# Patient Record
Sex: Female | Born: 1970 | Race: Black or African American | Hispanic: No | Marital: Single | Smoking: Current every day smoker
Health system: Southern US, Community
[De-identification: ages and names within clinical notes are randomized; demographics above are authoritative.]

## PROBLEM LIST (undated history)

## (undated) DIAGNOSIS — F329 Major depressive disorder, single episode, unspecified: Secondary | ICD-10-CM

## (undated) DIAGNOSIS — F32A Depression, unspecified: Secondary | ICD-10-CM

## (undated) DIAGNOSIS — R51 Headache: Secondary | ICD-10-CM

## (undated) DIAGNOSIS — I10 Essential (primary) hypertension: Secondary | ICD-10-CM

## (undated) DIAGNOSIS — F419 Anxiety disorder, unspecified: Secondary | ICD-10-CM

## (undated) HISTORY — PX: DILATION AND CURETTAGE OF UTERUS: SHX78

## (undated) HISTORY — DX: Depression, unspecified: F32.A

## (undated) HISTORY — DX: Anxiety disorder, unspecified: F41.9

## (undated) HISTORY — DX: Major depressive disorder, single episode, unspecified: F32.9

---

## 2004-01-25 ENCOUNTER — Emergency Department (HOSPITAL_COMMUNITY): Admission: EM | Admit: 2004-01-25 | Discharge: 2004-01-25 | Payer: Self-pay | Admitting: Family Medicine

## 2004-05-05 ENCOUNTER — Emergency Department (HOSPITAL_COMMUNITY): Admission: EM | Admit: 2004-05-05 | Discharge: 2004-05-05 | Payer: Self-pay | Admitting: Family Medicine

## 2009-01-02 ENCOUNTER — Ambulatory Visit: Payer: Self-pay | Admitting: Advanced Practice Midwife

## 2009-01-02 ENCOUNTER — Inpatient Hospital Stay (HOSPITAL_COMMUNITY): Admission: AD | Admit: 2009-01-02 | Discharge: 2009-01-02 | Payer: Self-pay | Admitting: Obstetrics & Gynecology

## 2009-01-23 ENCOUNTER — Other Ambulatory Visit: Admission: RE | Admit: 2009-01-23 | Discharge: 2009-01-23 | Payer: Self-pay | Admitting: Obstetrics and Gynecology

## 2009-01-23 ENCOUNTER — Ambulatory Visit: Payer: Self-pay | Admitting: Obstetrics and Gynecology

## 2009-03-25 ENCOUNTER — Ambulatory Visit (HOSPITAL_COMMUNITY): Admission: RE | Admit: 2009-03-25 | Discharge: 2009-03-25 | Payer: Self-pay | Admitting: Obstetrics & Gynecology

## 2009-04-10 ENCOUNTER — Ambulatory Visit: Payer: Self-pay | Admitting: Obstetrics and Gynecology

## 2009-04-17 ENCOUNTER — Ambulatory Visit: Payer: Self-pay | Admitting: Family Medicine

## 2009-04-17 DIAGNOSIS — I10 Essential (primary) hypertension: Secondary | ICD-10-CM | POA: Insufficient documentation

## 2009-04-23 ENCOUNTER — Telehealth: Payer: Self-pay | Admitting: Family Medicine

## 2009-05-16 ENCOUNTER — Ambulatory Visit: Payer: Self-pay | Admitting: Family Medicine

## 2009-07-11 ENCOUNTER — Ambulatory Visit: Payer: Self-pay | Admitting: Obstetrics & Gynecology

## 2009-09-26 ENCOUNTER — Ambulatory Visit: Payer: Self-pay | Admitting: Obstetrics and Gynecology

## 2009-12-12 ENCOUNTER — Ambulatory Visit: Payer: Self-pay | Admitting: Obstetrics & Gynecology

## 2010-02-27 ENCOUNTER — Encounter (INDEPENDENT_AMBULATORY_CARE_PROVIDER_SITE_OTHER): Payer: Self-pay | Admitting: *Deleted

## 2010-02-27 ENCOUNTER — Ambulatory Visit: Payer: Self-pay | Admitting: Obstetrics & Gynecology

## 2010-02-27 ENCOUNTER — Other Ambulatory Visit
Admission: RE | Admit: 2010-02-27 | Discharge: 2010-02-27 | Payer: Self-pay | Source: Home / Self Care | Admitting: Obstetrics & Gynecology

## 2010-04-13 ENCOUNTER — Encounter: Payer: Self-pay | Admitting: *Deleted

## 2010-04-22 NOTE — Assessment & Plan Note (Signed)
Summary: NP/HTN/KF   Vital Signs:  Patient profile:   40 year old female Height:      64.5 inches Weight:      201.9 pounds BMI:     34.24 Temp:     98.6 degrees F oral Pulse rate:   102 / minute BP sitting:   151 / 99  (left arm) Cuff size:   regular  Vitals Entered By: Gladstone Pih (April 17, 2009 4:40 PM)  Nutrition Counseling: Patient's BMI is greater than 25 and therefore counseled on weight management options.  Serial Vital Signs/Assessments:  Time      Position  BP       Pulse  Resp  Temp     By 4:41 PM             162/102                        Gladstone Pih 5:03 PM             134/94                         Lequita Asal  MD  Comments: 4:41 PM re checked manually By: Gladstone Pih   CC: NP HTN Is Patient Diabetic? No Pain Assessment Patient in pain? no        CC:  NP HTN.  History of Present Illness: 40 y/o female referred from [facility] 2/2 hypertension.   HTN- strong family history. h/o migraine, but states she has had recent headaches that were different from her migraines. no slurred speech, neuro sxs, blurred vision, chest pain, SOB, or peripheral edema. initially started on hctz 25 mg by gyn without improvement in BP. norvasc 10 mg was added. BPs remained elevated. patient with h/o tobacco abuse. no side effects from meds. reports drinking " a few beers" on the weekends.   Habits & Providers  Alcohol-Tobacco-Diet     Tobacco Status: current     Tobacco Counseling: to quit use of tobacco products     Cigarette Packs/Day: 0.5  Current Medications (verified): 1)  Norvasc 10 Mg Tabs (Amlodipine Besylate) .... One Tab By Mouth Daily 2)  Hydrochlorothiazide 25 Mg Tabs (Hydrochlorothiazide) .... One Tab By Mouth Daily  Allergies (verified): No Known Drug Allergies  Past History:  Past medical, surgical, family and social histories (including risk factors) reviewed, and no changes noted (except as noted below).  Past Medical  History: abnormal uterine bleeding  Family History: Reviewed history and no changes required. HTN- sister, mom, dad, "entire family" CVA-mom DM- mom, dad  Social History: Reviewed history and no changes required. lives with 2 sons. currently employed, works Cytogeneticist. +tobacco abuse. occasional EtOH. no ilicit drug use. Smoking Status:  current Packs/Day:  0.5  Review of Systems       The patient complains of headaches.  The patient denies vision loss, chest pain, and dyspnea on exertion.    Physical Exam  General:  obese female. NAD. vitals reviewed.  Eyes:  EOMI, PERRLA Mouth:  MMM Neck:  no carotid bruits Lungs:  Normal respiratory effort, chest expands symmetrically. Lungs are clear to auscultation, no crackles or wheezes. Heart:  Normal rate and regular rhythm. S1 and S2 normal without gallop, murmur, click, rub or other extra sounds. Extremities:  no peripheral edema of BLE Neurologic:  alert & oriented X3 and cranial nerves II-XII intact.     Impression &  Recommendations:  Problem # 1:  ESSENTIAL HYPERTENSION (ICD-401.9) Assessment New BP improved but elevated on repeat. will add lisinopril. patient to return for FLP, CMP, urinalysis. follow up in 3-4 weeks.   Her updated medication list for this problem includes:    Norvasc 10 Mg Tabs (Amlodipine besylate) ..... One tab by mouth daily    Lisinopril-hydrochlorothiazide 20-25 Mg Tabs (Lisinopril-hydrochlorothiazide) ..... One tab by mouth daily  Future Orders: Comp Met-[facility] ([id number]) ... 03/28/2010 Lipid-[facility] ([id number]) ... 04/03/2010 UA Microalbumin-[facility] ([id number]) ... 03/27/2010  Complete Medication List: 1)  Norvasc 10 Mg Tabs (Amlodipine besylate) .... One tab by mouth daily 2)  Lisinopril-hydrochlorothiazide 20-25 Mg Tabs (Lisinopril-hydrochlorothiazide) .... One tab by mouth daily  Patient Instructions: 1)  Nice to have met you! 2)  We will change your blood pressure medication to  LISINOPRIL-HYDROCHLOROTHIAZIDE.  3)  Call tomorrow to schedule lab appointment 4)  Follow up with Dr. Lanier Prude in 3-4 weeks to follow up on blood pressure.  Prescriptions: LISINOPRIL-HYDROCHLOROTHIAZIDE 20-25 MG TABS (LISINOPRIL-HYDROCHLOROTHIAZIDE) one tab by mouth daily  #30 x 1   Entered and Authorized by:   Lequita Asal  MD   Signed by:   Lequita Asal  MD on 04/17/2009   Method used:   Electronically to        [facility] [address] (retail)       [id number]       [place], [place]  [address]       Ph: [phone]       Fax: [phone]   RxID:   [id number]     Prevention & Chronic Care Immunizations   Influenza vaccine: Not documented    Tetanus booster: Not documented    Pneumococcal vaccine: Not documented  Other Screening   Pap smear: Not documented    Mammogram: Not documented   Smoking status: current  (04/17/2009)  Lipids   Total Cholesterol: Not documented   LDL: Not documented   LDL Direct: Not documented   HDL: Not documented   Triglycerides: Not documented  Hypertension   Last Blood Pressure: 151 / 99  (04/17/2009)   Serum creatinine: Not documented   Serum potassium Not documented CMP ordered     Hypertension flowsheet reviewed?: Yes   Progress toward BP goal: Unchanged  Self-Management Support :   Personal Goals (by the next clinic visit) :      Personal blood pressure goal: 140/90  (04/17/2009)   Patient will work on the following items until the next clinic visit to reach self-care goals:     Medications and monitoring: take my medicines every day, bring all of my medications to every visit  (04/17/2009)     Eating: eat foods that are low in salt  (04/17/2009)     Activity: take a 30 minute walk every day, take the stairs instead of the elevator  (04/17/2009)    Hypertension self-management support: BP self-monitoring log, Written self-care plan, Education handout  (04/17/2009)    Hypertension self-care plan printed.   Hypertension education handout printed

## 2010-04-22 NOTE — Assessment & Plan Note (Signed)
Summary: BP CHECK/KH  Nurse Visit patient was scheduled with Dr. Lanier Prude today but had to reschedule due to insurance. Dr. Lanier Prude ask that her BP be checked today. BP 151/104 with Dynamap. rechecked manually using large adult cuff in right arm 160/104. pulse 80.  patient states when she checked her BP this AM it was low 104 systolic. she did not take her medication today. Dr. Lanier Prude suggested she could cut her BP med in half  until she comes in to see her  if  BP is low.  . RN suggested to patient that maybe there is a problem with her monitor and to bring it in the next time she comes  in. Theresia Lo RN  May 16, 2009 5:20 PM   Allergies: No Known Drug Allergies  Orders Added: 1)  No Charge Patient Arrived (NCPA0) [NCPA0]

## 2010-04-22 NOTE — Progress Notes (Signed)
Summary: triage  Phone Note Call from Patient Call back at [phone]   Caller: Patient Summary of Call: Taking blood pressure pills could this be a side effect? Initial call taken by: Clydell Hakim,  April 23, 2009 2:39 PM  Follow-up for Phone Call        left message Follow-up by: Golden Circle RN,  April 23, 2009 2:44 PM  Additional Follow-up for Phone Call Additional follow up Details #1::        has sore throat x 1 wk. pain is 4/10. worse hs. denies fever. smokes 1/2 ppd. offered appt. states she is going to gargle & see if it worsens. advised smoking is making it worse. has lab Thursday. told her if she feels she needs to see a doctor, call in early am Additional Follow-up by: Golden Circle RN,  April 23, 2009 2:53 PM

## 2010-05-09 ENCOUNTER — Encounter: Payer: Self-pay | Admitting: *Deleted

## 2010-05-14 ENCOUNTER — Ambulatory Visit (INDEPENDENT_AMBULATORY_CARE_PROVIDER_SITE_OTHER): Payer: BC Managed Care – PPO | Admitting: Family Medicine

## 2010-05-14 ENCOUNTER — Encounter: Payer: Self-pay | Admitting: Family Medicine

## 2010-05-14 DIAGNOSIS — G43909 Migraine, unspecified, not intractable, without status migrainosus: Secondary | ICD-10-CM | POA: Insufficient documentation

## 2010-05-14 DIAGNOSIS — I10 Essential (primary) hypertension: Secondary | ICD-10-CM

## 2010-05-14 DIAGNOSIS — R3589 Other polyuria: Secondary | ICD-10-CM | POA: Insufficient documentation

## 2010-05-14 DIAGNOSIS — R358 Other polyuria: Secondary | ICD-10-CM

## 2010-05-14 DIAGNOSIS — Z833 Family history of diabetes mellitus: Secondary | ICD-10-CM

## 2010-05-14 DIAGNOSIS — L83 Acanthosis nigricans: Secondary | ICD-10-CM

## 2010-05-14 LAB — GLUCOSE, CAPILLARY: Glucose-Capillary: 103 mg/dL — ABNORMAL HIGH (ref 70–99)

## 2010-05-14 MED ORDER — METOPROLOL SUCCINATE ER 25 MG PO TB24: 25.0000 mg | ORAL_TABLET | Freq: Every day | ORAL | Status: DC

## 2010-05-14 MED ORDER — BUTALBITAL-APAP-CAFFEINE 50-325-40 MG PO TABS: 1.0000 | ORAL_TABLET | ORAL | Status: AC | PRN

## 2010-05-14 NOTE — Progress Notes (Signed)
  Subjective:    Patient ID: Shirley Hernandez, female    DOB: June 12, 1970, 40 y.o.   MRN: [id number]  HPI 1. HTN Pt. Has an elevated BP, and runs high at home as well. 145/100 We will stop her amlodipine and start metoprolol She also complains of headaches. No visual changes.  2. Migraine Will give her fioricet, she was taking this in the past and says her ibuprofen is not helping.  3. Acanthosis, Polyuria, Polydipsia, Family history DM2 She has no prior labs. Will get a random finger stick for glucose, she last ate at 12pm     Review of Systems  All other systems reviewed and are negative.       Objective:   Physical Exam  Constitutional: She appears well-developed and well-nourished.  HENT:  Head: Normocephalic and atraumatic.  Cardiovascular: Normal rate, regular rhythm and normal heart sounds.   Pulmonary/Chest: Effort normal and breath sounds normal.          Assessment & Plan:  1. HTN Pt. Has an elevated BP, and runs high at home as well. 145/100 We will stop her amlodipine and start metoprolol She also complains of headaches. No visual changes.  2. Migraine Will give her fioricet, she was taking this in the past and says her ibuprofen is not helping.  3. Acanthosis, Polyuria, Polydipsia, Family history DM2 She has no prior labs. Will get a random finger stick for glucose, she last ate at 12pm

## 2010-05-16 ENCOUNTER — Telehealth: Payer: Self-pay | Admitting: Family Medicine

## 2010-05-16 NOTE — Telephone Encounter (Signed)
Pt still waiting on rx for lisinopril/hctz, pt goes to [facility]/[address], suppose to have been called in at last visit.

## 2010-05-19 MED ORDER — LISINOPRIL-HYDROCHLOROTHIAZIDE 20-25 MG PO TABS: 1.0000 | ORAL_TABLET | Freq: Every day | ORAL | Status: DC

## 2010-05-19 NOTE — Telephone Encounter (Signed)
I sent her prescription in.

## 2010-05-21 ENCOUNTER — Ambulatory Visit (INDEPENDENT_AMBULATORY_CARE_PROVIDER_SITE_OTHER): Payer: BC Managed Care – PPO

## 2010-05-21 DIAGNOSIS — Z3049 Encounter for surveillance of other contraceptives: Secondary | ICD-10-CM

## 2010-05-21 DIAGNOSIS — N949 Unspecified condition associated with female genital organs and menstrual cycle: Secondary | ICD-10-CM

## 2010-06-03 LAB — POCT URINALYSIS DIPSTICK
Bilirubin Urine: NEGATIVE
Glucose, UA: NEGATIVE mg/dL
Nitrite: NEGATIVE
Specific Gravity, Urine: 1.025 (ref 1.005–1.030)
Urobilinogen, UA: 0.2 mg/dL (ref 0.0–1.0)
pH: 6.5 (ref 5.0–8.0)

## 2010-06-20 ENCOUNTER — Telehealth: Payer: Self-pay | Admitting: Family Medicine

## 2010-06-20 NOTE — Telephone Encounter (Signed)
Pt states she lost rx for Toprol (migrain med) needs it called to [facility] [address]

## 2010-06-26 LAB — CBC
HCT: 36.9 % (ref 36.0–46.0)
Hemoglobin: 12.4 g/dL (ref 12.0–15.0)
MCHC: 33.7 g/dL (ref 30.0–36.0)
MCV: 89.8 fL (ref 78.0–100.0)
RDW: 14.2 % (ref 11.5–15.5)

## 2010-06-26 LAB — URINALYSIS, ROUTINE W REFLEX MICROSCOPIC
Bilirubin Urine: NEGATIVE
Glucose, UA: NEGATIVE mg/dL
Leukocytes, UA: NEGATIVE
Nitrite: NEGATIVE
Urobilinogen, UA: 1 mg/dL (ref 0.0–1.0)
pH: 6 (ref 5.0–8.0)

## 2010-06-26 LAB — URINE MICROSCOPIC-ADD ON

## 2010-07-07 ENCOUNTER — Encounter: Payer: Self-pay | Admitting: *Deleted

## 2010-07-07 ENCOUNTER — Other Ambulatory Visit: Payer: Self-pay | Admitting: Family Medicine

## 2010-07-07 NOTE — Telephone Encounter (Signed)
Need to speak with patient and unable to leave message on voicemail. Called pharmacy and asked them to have her call our office. Will forward to Dr. Rivka Safer to ask about refilling .

## 2010-07-07 NOTE — Telephone Encounter (Signed)
Refill request

## 2010-08-07 ENCOUNTER — Ambulatory Visit: Payer: BC Managed Care – PPO

## 2010-09-08 ENCOUNTER — Other Ambulatory Visit: Payer: Self-pay | Admitting: Family Medicine

## 2010-09-08 ENCOUNTER — Ambulatory Visit (INDEPENDENT_AMBULATORY_CARE_PROVIDER_SITE_OTHER): Payer: BC Managed Care – PPO

## 2010-09-08 DIAGNOSIS — Z3049 Encounter for surveillance of other contraceptives: Secondary | ICD-10-CM

## 2010-10-14 ENCOUNTER — Encounter: Payer: Self-pay | Admitting: Family Medicine

## 2010-10-14 ENCOUNTER — Ambulatory Visit (INDEPENDENT_AMBULATORY_CARE_PROVIDER_SITE_OTHER): Payer: BC Managed Care – PPO | Admitting: Family Medicine

## 2010-10-14 DIAGNOSIS — I10 Essential (primary) hypertension: Secondary | ICD-10-CM

## 2010-10-14 DIAGNOSIS — L259 Unspecified contact dermatitis, unspecified cause: Secondary | ICD-10-CM

## 2010-10-14 DIAGNOSIS — L309 Dermatitis, unspecified: Secondary | ICD-10-CM

## 2010-10-14 MED ORDER — TRIAMCINOLONE ACETONIDE 0.025 % EX OINT: TOPICAL_OINTMENT | Freq: Two times a day (BID) | CUTANEOUS | Status: DC

## 2010-10-14 MED ORDER — LISINOPRIL-HYDROCHLOROTHIAZIDE 20-25 MG PO TABS: 1.0000 | ORAL_TABLET | Freq: Every day | ORAL | Status: DC

## 2010-10-14 MED ORDER — METOPROLOL SUCCINATE ER 25 MG PO TB24: 25.0000 mg | ORAL_TABLET | Freq: Every day | ORAL | Status: DC

## 2010-10-14 NOTE — Progress Notes (Signed)
  Subjective:    Patient ID: Shirley Hernandez, female    DOB: [birth year], 40 y.o.   MRN: [id number]  HPI 1.  Hypertension:  Long-term problem for this patient.  No adverse effects from medication.  Not checking it regularly.  No HA, CP, dizziness, shortness of breath, palpitations, or LE swelling.   BP Readings from Last 3 Encounters:  10/14/10 134/96  05/14/10 147/102  04/17/09 151/99    Eczema:  Located on arms, back of neck.  Controlled in past with Triamcinolone.  Using Eucerin cream for moisturizer.  Wants to know about refill.  No asthma or SOB   Review of Systems See HPI above for review of systems.       Objective:   Physical Exam Gen:  Alert, cooperative patient who appears stated age in no acute distress.  Vital signs reviewed. Cardiac:  Regular rate and rhythm without murmur auscultated.  Good S1/S2. Pulm:  Clear to auscultation bilaterally with good air movement.  No wheezes or rales noted.   Neck: No masses or thyromegaly or limitation in range of motion.  No cervical lymphadenopathy. Ext:  No clubbing/cyanosis/erythema.  No edema noted bilateral lower extremities.   Skin:  Eczematous changes noted BL upper extremities and back of neck       Assessment & Plan:

## 2010-10-14 NOTE — Assessment & Plan Note (Signed)
Improved and now at goal. Refilled medications.  Will follow

## 2010-10-14 NOTE — Assessment & Plan Note (Signed)
Refilled Triamcinolone and counseled regarding skin care.

## 2010-10-14 NOTE — Patient Instructions (Signed)
I have refilled your medications. I have sent in the Triamcinolone.  If you have any questions, don't hesitate to call.  It was good to meet you today.

## 2010-11-13 IMAGING — US US TRANSVAGINAL NON-OB
1 series · 13 of 25 positions shown · non-contrast
Comparison: None

CLINICAL DATA: Menorrhagia

TRANSABDOMINAL AND TRANSVAGINAL ULTRASOUND OF PELVIS
TECHNIQUE: Both transabdominal and transvaginal ultrasound
examinations of the pelvis were performed including evaluation of
the uterus, ovaries, adnexal regions, and pelvic cul-de-sac.

[Series 1: us pelvis complete modify · 13 of 54 slices shown]
[im 1/54]
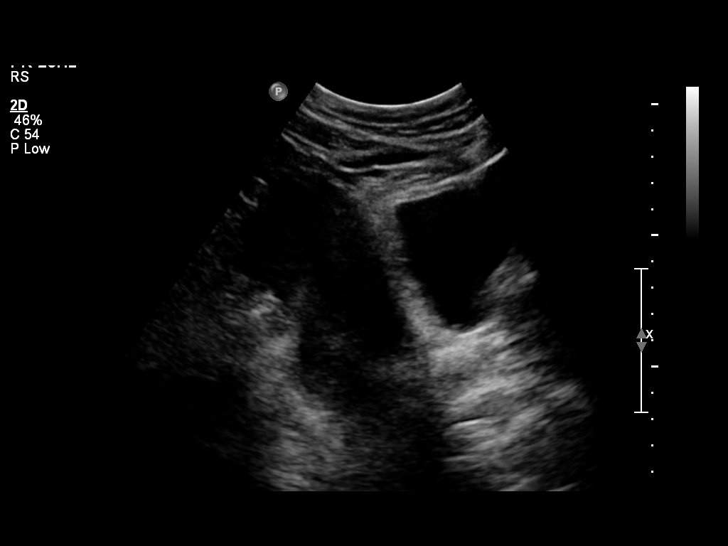
[im 5/54]
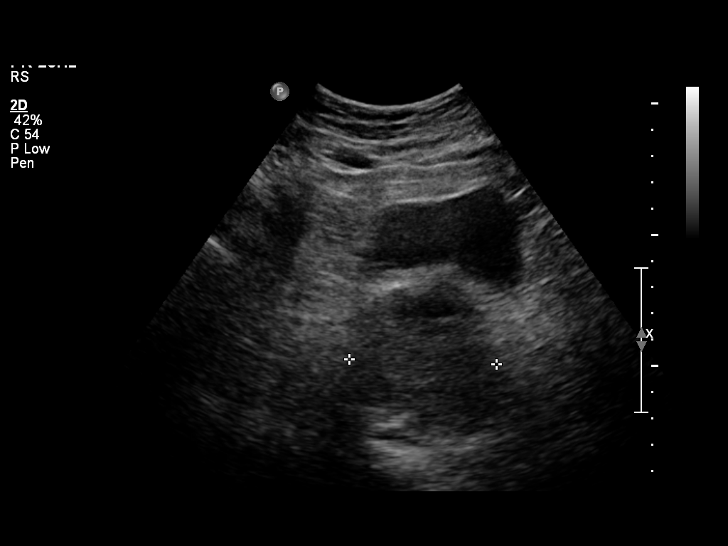
[im 9/54]
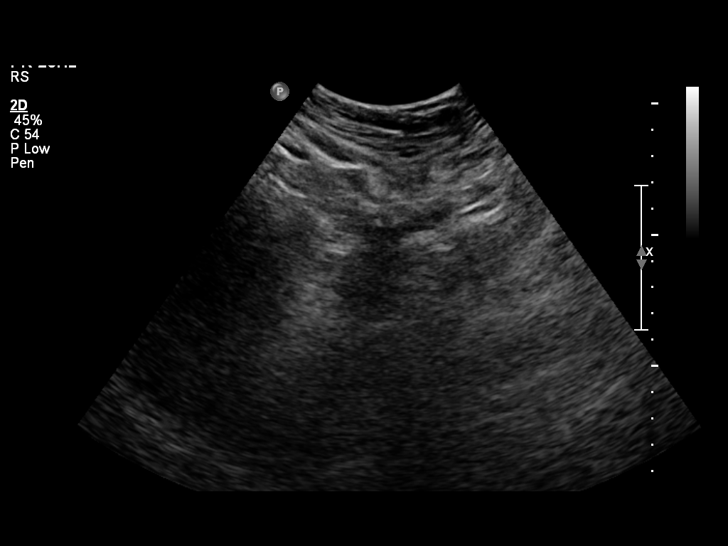
[im 14/54]
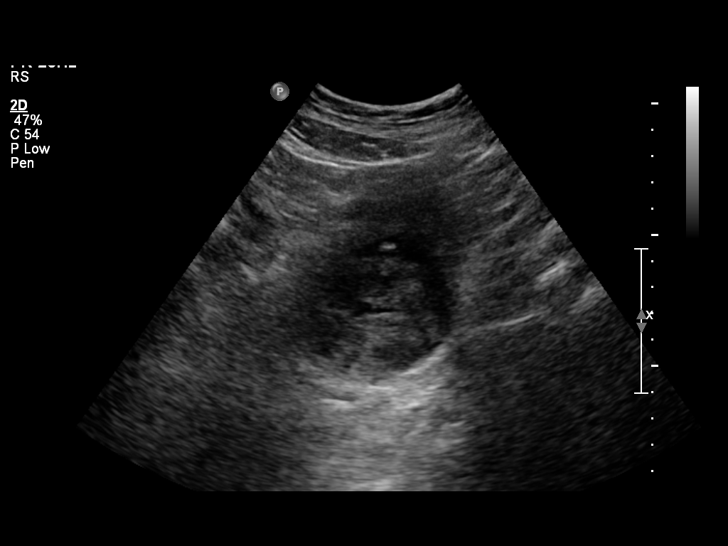
[im 18/54]
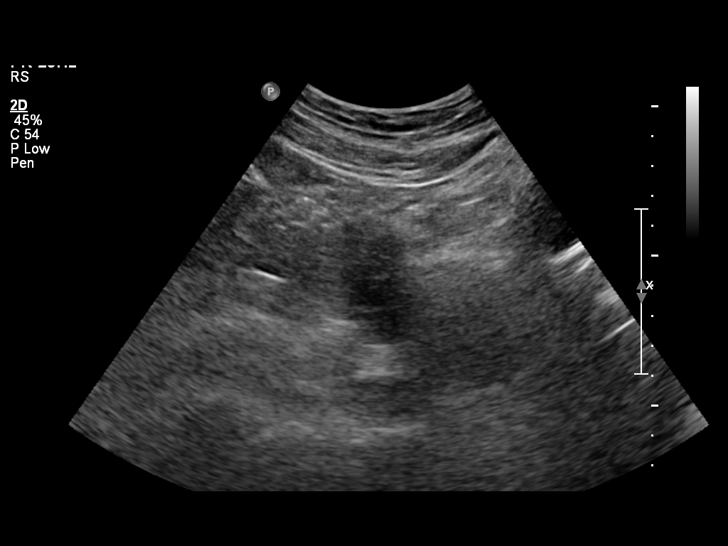
[im 23/54]
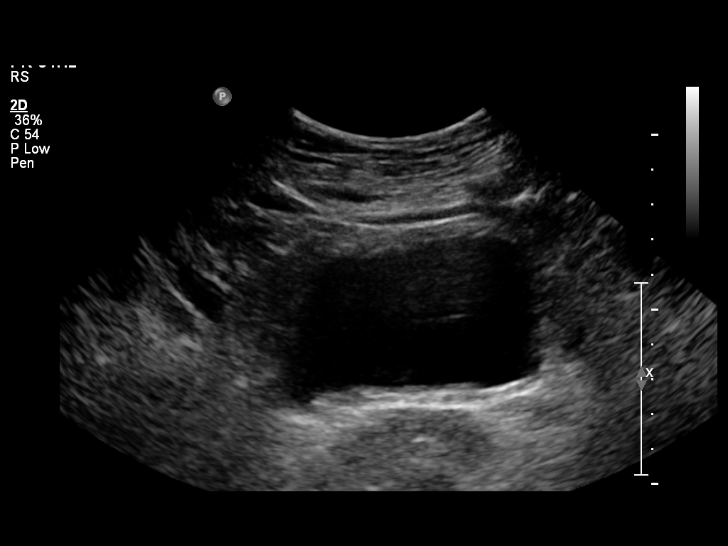
[im 27/54]
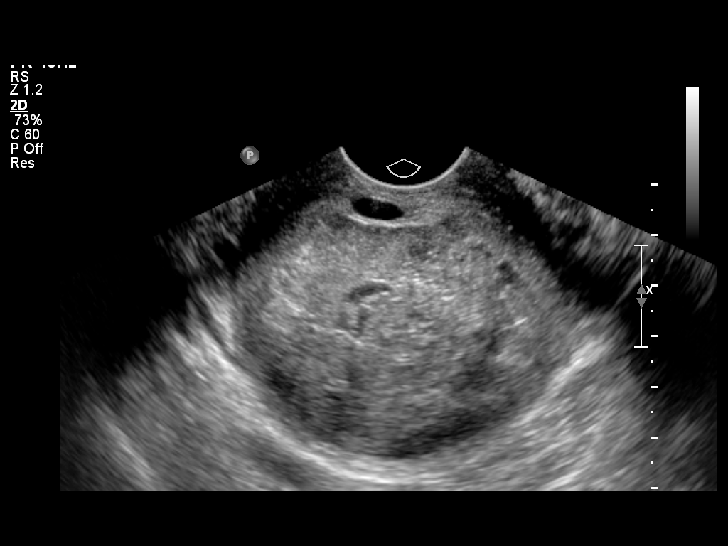
[im 31/54]
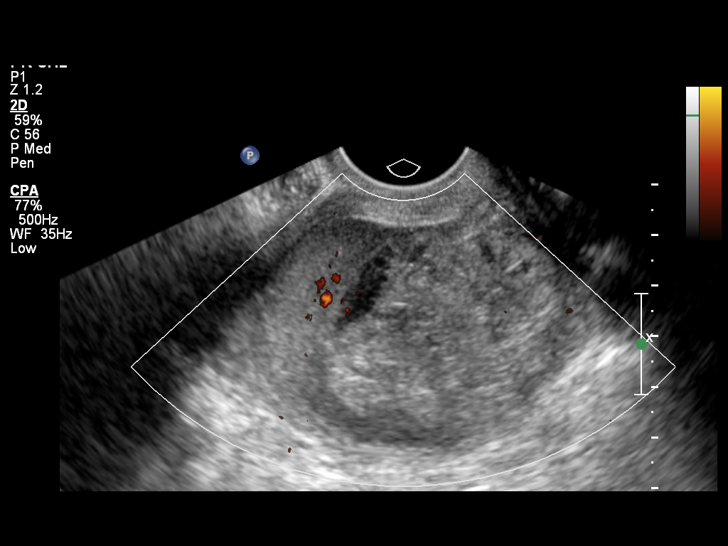
[im 36/54]
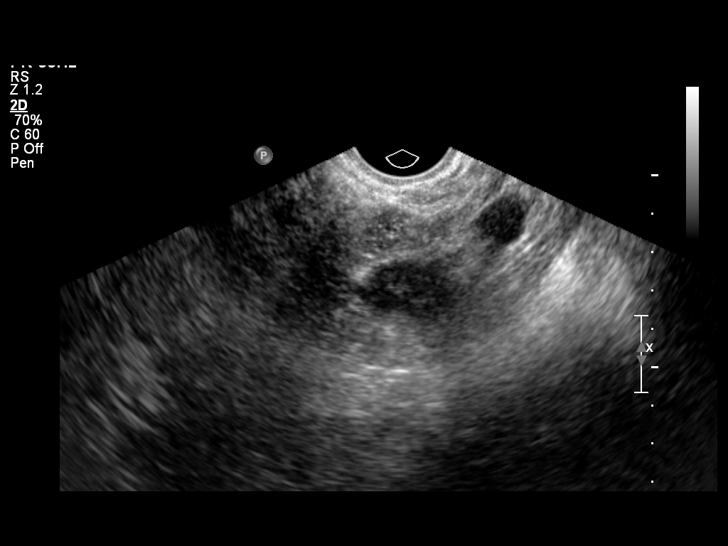
[im 40/54]
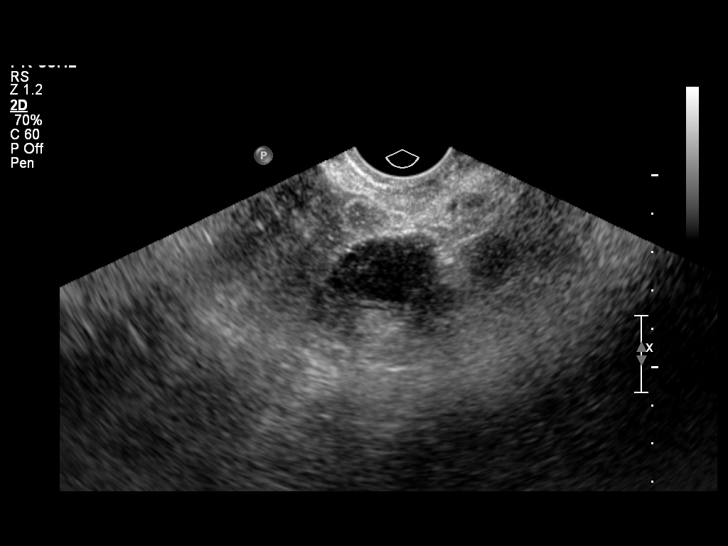
[im 45/54]
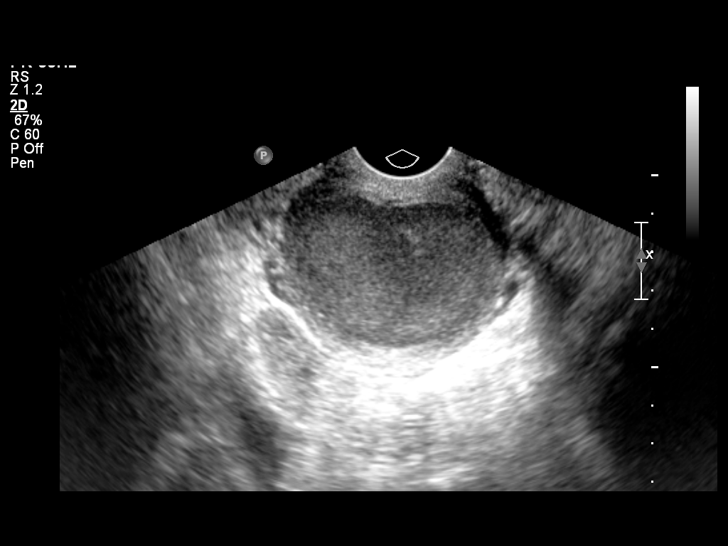
[im 49/54]
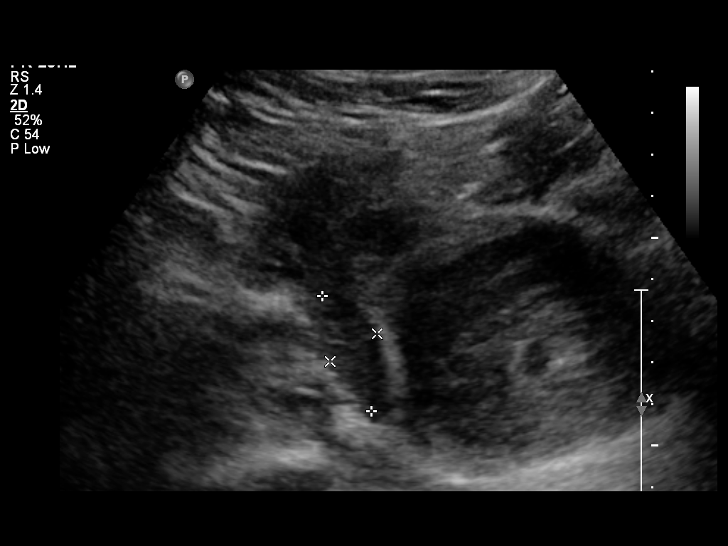
[im 54/54]
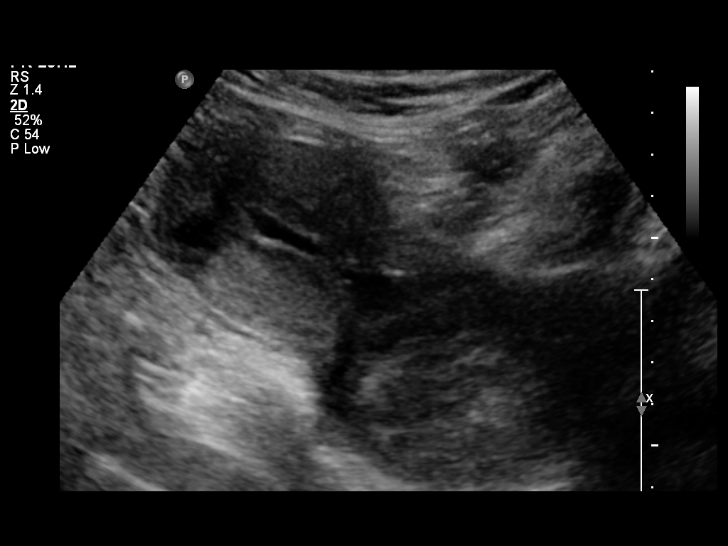

[13 of 25 positions shown; findings below may reference images not displayed]

FINDINGS: Uterus measures 9.9 x 5.2 x 4.6 cm.  On real time imaging, there is
a heterogeneous filling defect/soft tissue mass lesion in the lower
uterine segment apparently within the endometrial canal, measuring
5.4 x 5.2 x 3.9 cm.  Over the course of the examination, this was
noted on real time imaging to migrate further lower within the
lower uterine segment to the level of the cervix, with swirling
echogenic presumed fluid surrounding it.  No central color flow is
noted within this mass.

Endometrium not well visualized due to central filling defect and
debris/fluid.

Right Ovary measures 2.9 x 1.7 x 1.3 cm, unremarkable.

Left Ovary measures 2.6 x 2.1 x 1.2 cm.

Other Findings:  No free fluid.
IMPRESSION: Mass lesion within the endometrial canal which was observed to
migrated distally towards the cervix with surrounding fluid on real
time imaging.  This could represent a prolapsing fibroid, polyp, or
less likely neoplasm.  Report called to BLONDINACKA by sonographer Quirijn
Dhee at the time of imaging.

## 2010-11-26 ENCOUNTER — Ambulatory Visit: Payer: BC Managed Care – PPO

## 2011-02-03 IMAGING — US US TRANSVAGINAL NON-OB
1 series · 13 of 25 positions shown · non-contrast
Comparison: 01/02/2009

CLINICAL DATA: Pelvic pain.  Menorrhagia.  Follow-up endometrial
mass seen on prior exam.

TRANSVAGINAL ULTRASOUND OF PELVIS
TECHNIQUE: Transvaginal ultrasound examination of the pelvis was
performed including evaluation of the uterus, ovaries, adnexal
regions, and pelvic cul-de-sac.

[Series 1: us transvaginal non-ob · 0.15mm/px · 13 of 41 slices shown]
[im 1/41]
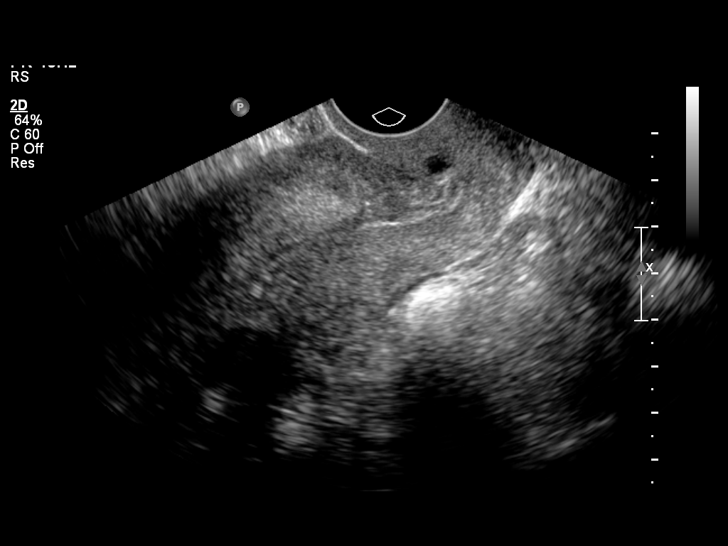
[im 4/41]
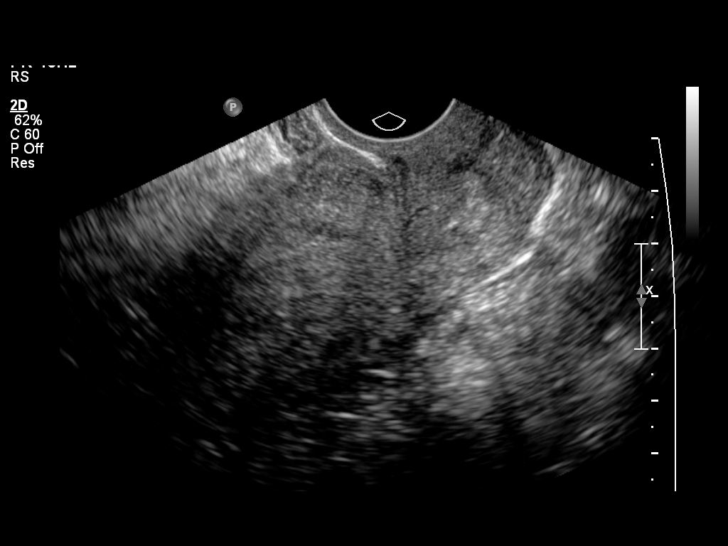
[im 7/41]
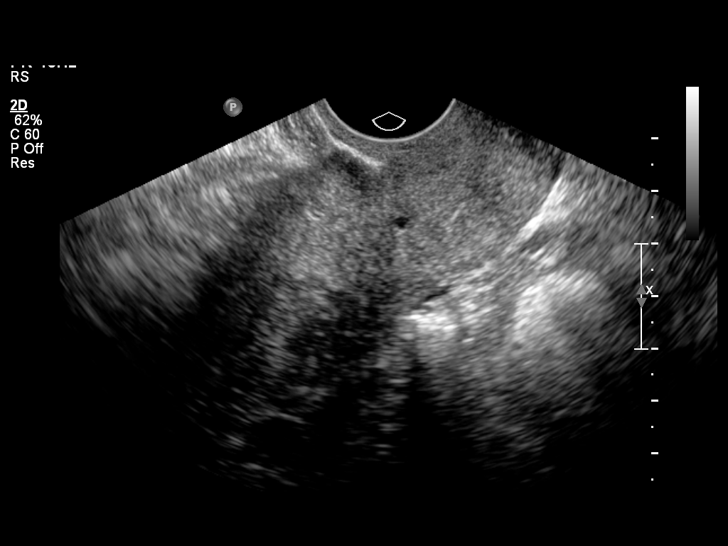
[im 11/41]
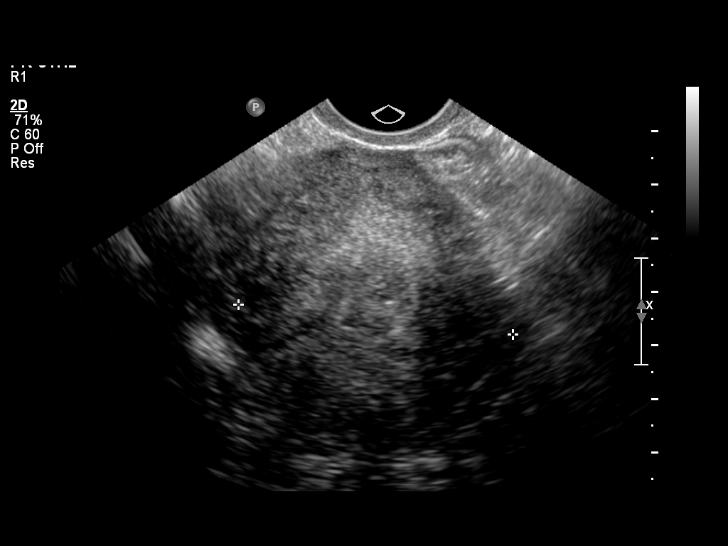
[im 14/41]
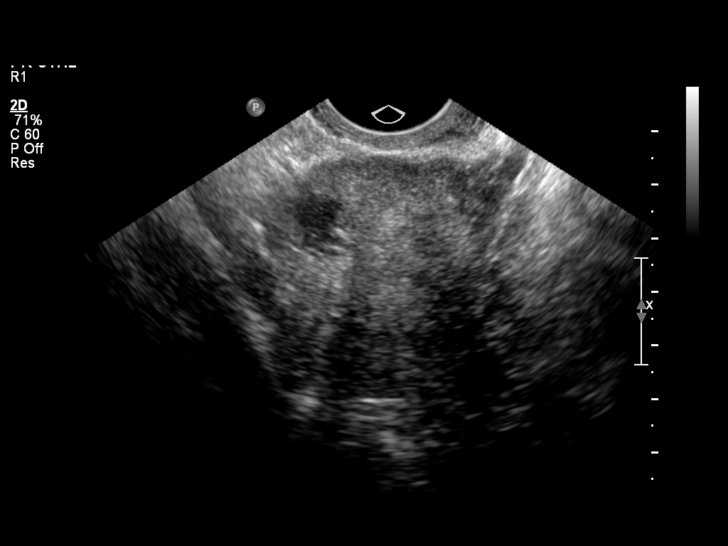
[im 17/41]
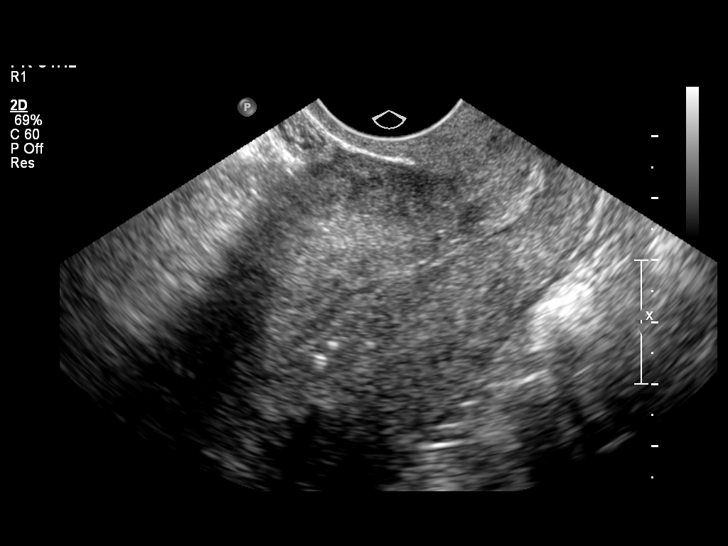
[im 21/41]
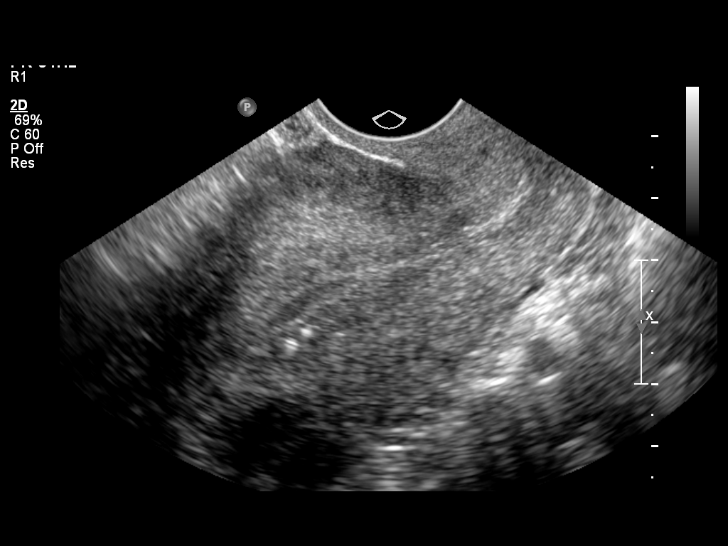
[im 24/41]
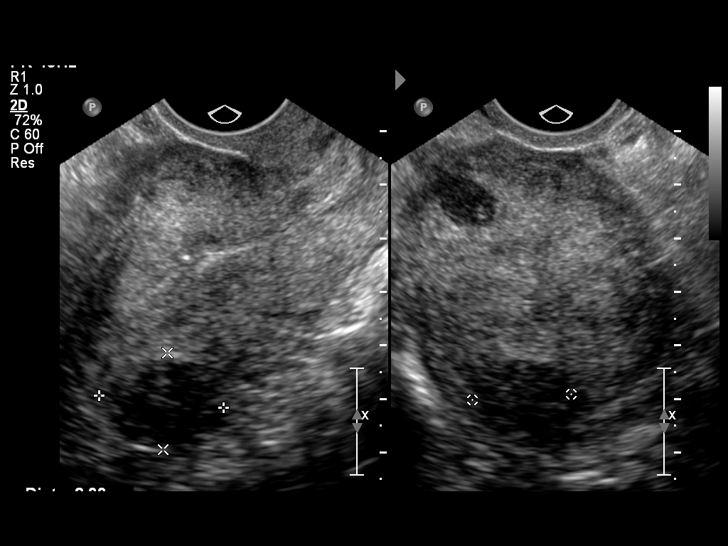
[im 27/41]
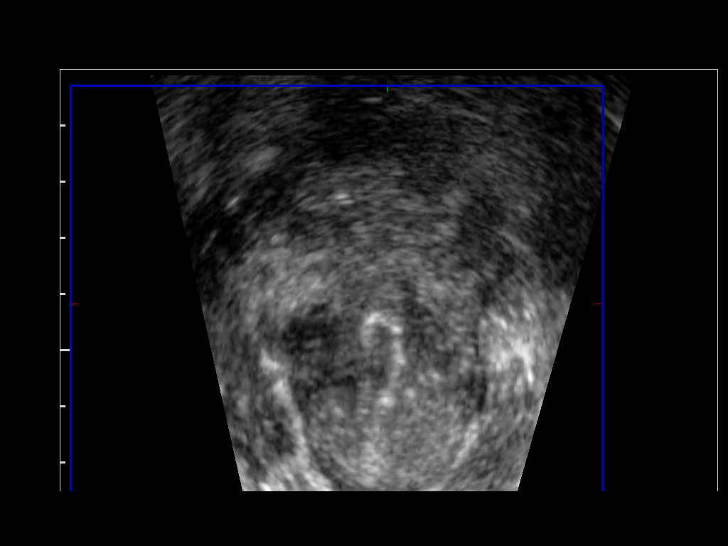
[im 31/41]
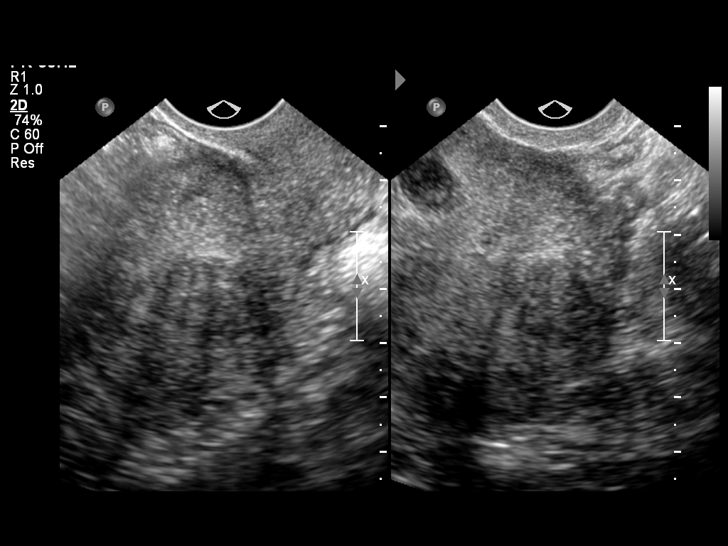
[im 34/41]
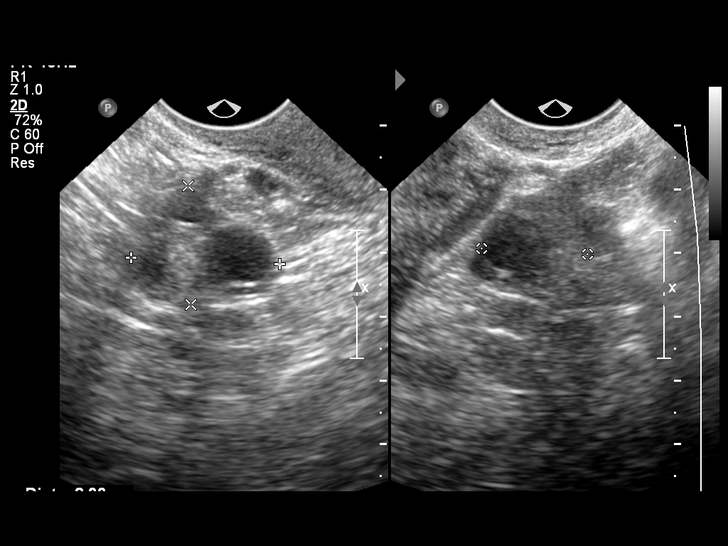
[im 37/41]
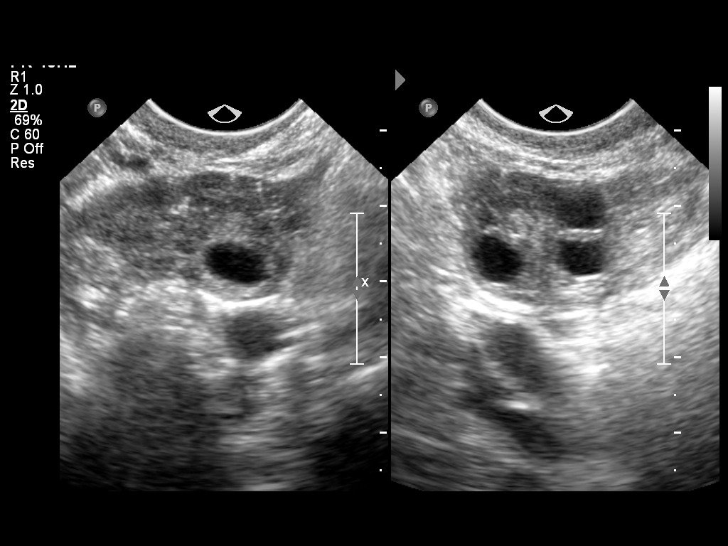
[im 41/41]
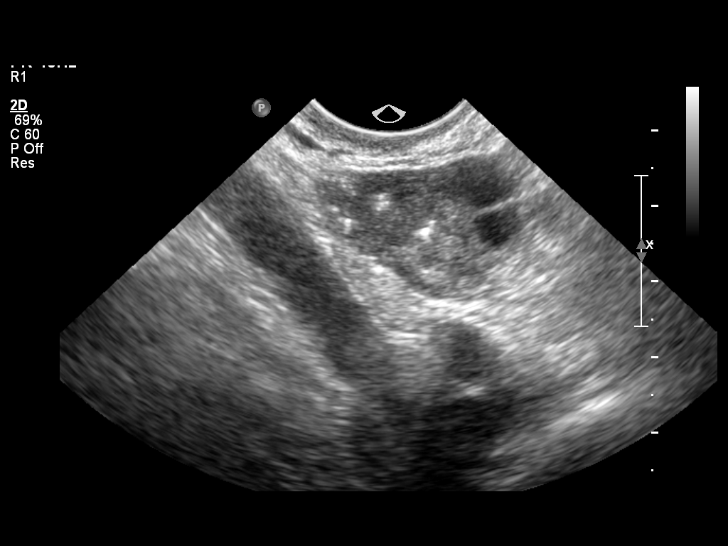

[13 of 25 positions shown; findings below may reference images not displayed]

FINDINGS: Uterus measures 8.9 x 4.3 x 5.2 cm.  Previously seen prolapsing
endometrial mass in the lower uterine segment is no longer
visualized on today's study. Several small fibroids are seen in
both the anterior and posterior uterine wall, which range in size
from 1.0 cm to 2.3 cm.

Endometrium measures 8 mm in thickness.  Several focal
calcifications are noted at the posterior endometrial - myometrial
junction, which may be from previous instrumentation.

Right Ovary measures 2.8 x 1.6 x 2.1 cm.  Normal appearance.

Left Ovary measures 2.3 x 1.9 x 1.7 cm.  Normal appearance.

Other Findings:  No adnexal mass or free fluid identified.
IMPRESSION: 1.  Several small uterine fibroids, largest measuring 2.3 cm.
Previously seen prolapsing endometrial mass in the lower uterine
segment is no longer visualized.
2.  Normal ovaries.  No adnexal mass or free fluid identified.

## 2011-02-17 ENCOUNTER — Encounter (HOSPITAL_COMMUNITY): Payer: Self-pay | Admitting: *Deleted

## 2011-02-17 ENCOUNTER — Inpatient Hospital Stay (HOSPITAL_COMMUNITY): Payer: Self-pay

## 2011-02-17 ENCOUNTER — Inpatient Hospital Stay (HOSPITAL_COMMUNITY)
Admission: AD | Admit: 2011-02-17 | Discharge: 2011-02-17 | Disposition: A | Discharge status: Home / Self Care | Payer: Self-pay | Source: Ambulatory Visit | Attending: Family Medicine | Admitting: Family Medicine

## 2011-02-17 DIAGNOSIS — N949 Unspecified condition associated with female genital organs and menstrual cycle: Secondary | ICD-10-CM

## 2011-02-17 DIAGNOSIS — N897 Hematocolpos: Secondary | ICD-10-CM

## 2011-02-17 DIAGNOSIS — N938 Other specified abnormal uterine and vaginal bleeding: Secondary | ICD-10-CM | POA: Insufficient documentation

## 2011-02-17 HISTORY — DX: Essential (primary) hypertension: I10

## 2011-02-17 LAB — CBC
MCH: 29.5 pg (ref 26.0–34.0)
Platelets: 152 10*3/uL (ref 150–400)
RBC: 4.37 MIL/uL (ref 3.87–5.11)
WBC: 7.1 10*3/uL (ref 4.0–10.5)

## 2011-02-17 LAB — WET PREP, GENITAL: Yeast Wet Prep HPF POC: NONE SEEN

## 2011-02-17 LAB — URINE MICROSCOPIC-ADD ON

## 2011-02-17 LAB — URINALYSIS, ROUTINE W REFLEX MICROSCOPIC
Glucose, UA: NEGATIVE mg/dL
Specific Gravity, Urine: >1.03 — ABNORMAL HIGH (ref 1.005–1.030)

## 2011-02-17 MED ORDER — KETOROLAC TROMETHAMINE 60 MG/2ML IM SOLN
60.0000 mg | Freq: Once | INTRAMUSCULAR | Status: AC
  Administered 2011-02-17: 60 mg via INTRAMUSCULAR
  Filled 2011-02-17: qty 2

## 2011-02-17 MED ORDER — HYDROCODONE-ACETAMINOPHEN 5-500 MG PO TABS: 1.0000 | ORAL_TABLET | Freq: Four times a day (QID) | ORAL | Status: AC | PRN

## 2011-02-17 NOTE — Progress Notes (Signed)
Patient states stomach pain since last week. "feels like labor pains"

## 2011-02-17 NOTE — ED Provider Notes (Signed)
History     No chief complaint on file.  HPI Pt is unsure if she is pregnant.  She was on Depo Provera until 5 months ago when she stopped due to lack of insurance.  Pt started her first period after amenorrhea on Depo.  Her period has been heavy at times with clots and cramping.  Her pain has progressively gotten worse, unrelieved by Midol or ibuprofen. She last took Ibuprofen about 10:30am. Pt denies vaginal discharge, constipation, diarrhea, UTI symptoms.     Past Medical History  Diagnosis Date  . Hypertension     History reviewed. No pertinent past surgical history.  Family History  Problem Relation Age of Onset  . Depression Mother   . Diabetes Mother   . Hypertension Mother   . Stroke Mother   . Hypertension Father     History  Substance Use Topics  . Smoking status: Current Some Day Smoker -- 1.0 packs/day  . Smokeless tobacco: Not on file  . Alcohol Use: No    Allergies: No Known Allergies  Prescriptions prior to admission  Medication Sig Dispense Refill  . ibuprofen (ADVIL,MOTRIN) 200 MG tablet Take 200 mg by mouth every 6 (six) hours as needed. For pain       . lisinopril-hydrochlorothiazide (PRINZIDE,ZESTORETIC) 20-25 MG per tablet Take 1 tablet by mouth daily.  90 tablet  2  . metoprolol succinate (TOPROL-XL) 25 MG 24 hr tablet Take 1 tablet (25 mg total) by mouth daily.  90 tablet  2    Review of Systems  Constitutional: Negative for fever and chills.  Gastrointestinal: Positive for abdominal pain. Negative for nausea, vomiting, diarrhea and constipation.  Genitourinary: Negative for dysuria and urgency.  Neurological: Negative for dizziness.   Physical Exam   Blood pressure 158/117, pulse 79, temperature 98.4 F (36.9 C), temperature source Oral, resp. rate 20, height 5\' 4"  (1.626 m), weight 208 lb (94.348 kg), last menstrual period 02/10/2011.  Physical Exam  Vitals reviewed. Constitutional: She is oriented to person, place, and time. She appears  well-developed and well-nourished.  HENT:  Head: Normocephalic.  Eyes: Pupils are equal, round, and reactive to light.  Neck: Normal range of motion. Neck supple.  Cardiovascular: Normal rate.   Respiratory: Effort normal.  GI: Soft. She exhibits no distension. There is tenderness. There is no rebound.  Genitourinary:       Scarred and stenotic os secondary to previous surgery for abnormal genital cytology and also long use of Depo Provera.  When penetrated with Q-tip, mod amount of dark red blood gushed out; uterus not appreciably enlarged but mildly tender; adnexal without palpable enlargement  Musculoskeletal: Normal range of motion.  Neurological: She is alert and oriented to person, place, and time.  Skin: Skin is warm and dry.  Psychiatric: She has a normal mood and affect.    MAU Course  Procedures Pelvic exam Wet prep GC/Chlamydia Pelvic ultrasound Toradol 60 mg IM CBC    Assessment and Plan    LINEBERRY,SUSAN 02/17/2011, 9:06 PM   I have assumed care of this pt from Horton Chin, FNP.  Results for orders placed during the hospital encounter of 02/17/11 (from the past 24 hour(s))  CBC     Status: Normal   Collection Time   02/17/11  7:46 PM      Component Value Range   WBC 7.1  4.0 - 10.5 (K/uL)   RBC 4.37  3.87 - 5.11 (MIL/uL)   Hemoglobin 12.9  12.0 - 15.0 (g/dL)  HCT 38.9  36.0 - 46.0 (%)   MCV 89.0  78.0 - 100.0 (fL)   MCH 29.5  26.0 - 34.0 (pg)   MCHC 33.2  30.0 - 36.0 (g/dL)   RDW 81.1  91.4 - 78.2 (%)   Platelets 152  150 - 400 (K/uL)  WET PREP, GENITAL     Status: Abnormal   Collection Time   02/17/11  9:01 PM      Component Value Range   Yeast, Wet Prep NONE SEEN  NONE SEEN    Trich, Wet Prep NONE SEEN  NONE SEEN    Clue Cells, Wet Prep MODERATE (*) NONE SEEN    WBC, Wet Prep HPF POC RARE (*) NONE SEEN   URINALYSIS, ROUTINE W REFLEX MICROSCOPIC     Status: Abnormal   Collection Time   02/17/11 10:00 PM      Component Value Range    Color, Urine BROWN (*) YELLOW    Appearance CLEAR  CLEAR    Specific Gravity, Urine >1.030 (*) 1.005 - 1.030    pH 5.5  5.0 - 8.0    Glucose, UA NEGATIVE  NEGATIVE (mg/dL)   Hgb urine dipstick LARGE (*) NEGATIVE    Bilirubin Urine NEGATIVE  NEGATIVE    Ketones, ur NEGATIVE  NEGATIVE (mg/dL)   Protein, ur NEGATIVE  NEGATIVE (mg/dL)   Urobilinogen, UA 0.2  0.0 - 1.0 (mg/dL)   Nitrite NEGATIVE  NEGATIVE    Leukocytes, UA NEGATIVE  NEGATIVE   URINE MICROSCOPIC-ADD ON     Status: Abnormal   Collection Time   02/17/11 10:00 PM      Component Value Range   Squamous Epithelial / LPF RARE  RARE    WBC, UA 3-6  <3 (WBC/hpf)   RBC / HPF TOO NUMEROUS TO COUNT  <3 (RBC/hpf)   Bacteria, UA FEW (*) RARE     US Transvaginal Non-ob  02/17/2011  *RADIOLOGY REPORT*  Clinical Data: Vaginal bleeding and cramping.  Cervical stenosis on exam.  History of uterine fibroids and dysfunctional uterine bleeding.  TRANSABDOMINAL AND TRANSVAGINAL ULTRASOUND OF PELVIS Technique:  Both transabdominal and transvaginal ultrasound examinations of the pelvis were performed. Transabdominal technique was performed for global imaging of the pelvis including uterus, ovaries, adnexal regions, and pelvic cul-de-sac.  Comparison: 03/25/2009 and 01/02/2009.   It was necessary to proceed with endovaginal exam following the transabdominal exam to visualize the the uterus and ovaries.  Findings:  Uterus: The uterus measures approximately 9.8 x 5.1 x 5.2 cm.  Two separate definable hypoechoic fibroids are noted in a predominately myometrial location at the level of the posterior fundus.  One of these measures 2.0 x 1.6 x 2.3 cm and the second approximately 2.0 x 1.4 x 1.8 cm.  Endometrium: The endometrium is significantly thickened with irregular hyperechoic products seen in a dilated endometrial cavity.  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  Correlation suggested with clinical  exam.  Right ovary:  The right ovary measures approximately 3.4 x 1.4 x 2.6 cm and has a normal sonographic appearance.  Left ovary: The left ovary is only visualized by transabdominal exam and is grossly normal measuring approximately 2.8 x 2.0 x 1.7 cm.  Other findings: No free fluid  IMPRESSION:  1.  Uterus shows two small fibroids. 2.  Heterogeneous and dilated endometrial canal containing what appear to be blood products.  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  Original Report Authenticated By: Reola Calkins, M.D.   US Pelvis Complete  02/17/2011  *RADIOLOGY REPORT*  Clinical Data: Vaginal bleeding and cramping.  Cervical stenosis on exam.  History of uterine fibroids and dysfunctional uterine bleeding.  TRANSABDOMINAL AND TRANSVAGINAL ULTRASOUND OF PELVIS Technique:  Both transabdominal and transvaginal ultrasound examinations of the pelvis were performed. Transabdominal technique was performed for global imaging of the pelvis including uterus, ovaries, adnexal regions, and pelvic cul-de-sac.  Comparison: 03/25/2009 and 01/02/2009.   It was necessary to proceed with endovaginal exam following the transabdominal exam to visualize the the uterus and ovaries.  Findings:  Uterus: The uterus measures approximately 9.8 x 5.1 x 5.2 cm.  Two separate definable hypoechoic fibroids are noted in a predominately myometrial location at the level of the posterior fundus.  One of these measures 2.0 x 1.6 x 2.3 cm and the second approximately 2.0 x 1.4 x 1.8 cm.  Endometrium: The endometrium is significantly thickened with irregular hyperechoic products seen in a dilated endometrial cavity.  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  Correlation suggested with clinical exam.  Right ovary:  The right ovary measures approximately 3.4 x 1.4 x 2.6 cm and has a normal sonographic appearance.  Left ovary: The left  ovary is only visualized by transabdominal exam and is grossly normal measuring approximately 2.8 x 2.0 x 1.7 cm.  Other findings: No free fluid  IMPRESSION:  1.  Uterus shows two small fibroids. 2.  Heterogeneous and dilated endometrial canal containing what appear to be blood products.  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  Original Report Authenticated By: Reola Calkins, M.D.    Discussed pt with Dr. Marice Potter at length.   A/P: Hematocolpos: discussed with pt at length. Will schedule pt in the GYN clinic to schedule outpt D&C. Will give Rx for lortab. Discussed diet, activity, risks, and precautions.  Clinton Gallant. Rice III, DrHSc, MPAS, PA-C   Henrietta Hoover, PA 02/17/11 2246

## 2011-02-17 NOTE — Progress Notes (Signed)
Pain in lower stomach and low back, feels like labor pains.  Started last wk.  Bleeding off and on, passing clots.

## 2011-02-17 NOTE — Progress Notes (Signed)
Last depo in June,  Had been on for like 61yrs

## 2011-02-18 LAB — GC/CHLAMYDIA PROBE AMP, GENITAL
Chlamydia, DNA Probe: NEGATIVE
GC Probe Amp, Genital: NEGATIVE

## 2011-02-18 LAB — POCT PREGNANCY, URINE: Preg Test, Ur: NEGATIVE

## 2011-03-13 ENCOUNTER — Encounter (HOSPITAL_COMMUNITY): Payer: Self-pay | Admitting: Pharmacist

## 2011-03-13 ENCOUNTER — Ambulatory Visit (INDEPENDENT_AMBULATORY_CARE_PROVIDER_SITE_OTHER): Payer: Self-pay | Admitting: Obstetrics & Gynecology

## 2011-03-13 ENCOUNTER — Encounter: Payer: Self-pay | Admitting: Obstetrics & Gynecology

## 2011-03-13 VITALS — BP 129/92 | HR 102 | Temp 98.3°F | Ht 66.0 in | Wt 207.2 lb

## 2011-03-13 DIAGNOSIS — N897 Hematocolpos: Secondary | ICD-10-CM

## 2011-03-13 DIAGNOSIS — N949 Unspecified condition associated with female genital organs and menstrual cycle: Secondary | ICD-10-CM

## 2011-03-13 DIAGNOSIS — R102 Pelvic and perineal pain: Secondary | ICD-10-CM

## 2011-03-13 MED ORDER — HYDROCODONE-ACETAMINOPHEN 5-500 MG PO TABS: 1.0000 | ORAL_TABLET | Freq: Four times a day (QID) | ORAL | Status: DC | PRN

## 2011-03-13 MED ORDER — MISOPROSTOL 200 MCG PO TABS: ORAL_TABLET | ORAL | Status: DC

## 2011-03-13 NOTE — Progress Notes (Signed)
  Subjective:    Patient ID: Shirley Hernandez, female    DOB: [birth year], 40 y.o.   MRN: [id number]  HPI Ms. Wenk is a 40 yo S AA G5P3A2 who was seen in the MAU last month for pelvic pain. A hematocolpos was seen along with 2 fibroids. Her cervix was described as stenotic.   Review of Systems     Objective:   Physical Exam        Assessment & Plan:  Hematocolpos and pelvic pain. I will schedule her d&c for 03-23-11. I will pretreat her with cytotec.

## 2011-03-18 ENCOUNTER — Other Ambulatory Visit: Payer: Self-pay

## 2011-03-18 ENCOUNTER — Encounter (HOSPITAL_COMMUNITY)
Admission: RE | Admit: 2011-03-18 | Discharge: 2011-03-18 | Disposition: A | Discharge status: Home / Self Care | Payer: Self-pay | Source: Ambulatory Visit | Attending: Obstetrics & Gynecology | Admitting: Obstetrics & Gynecology

## 2011-03-18 ENCOUNTER — Encounter (HOSPITAL_COMMUNITY): Payer: Self-pay

## 2011-03-18 HISTORY — DX: Headache: R51

## 2011-03-18 LAB — BASIC METABOLIC PANEL
BUN: 15 mg/dL (ref 6–23)
GFR calc Af Amer: >90 mL/min (ref >90)
GFR calc non Af Amer: 88 mL/min — ABNORMAL LOW (ref >90)
Potassium: 3.6 mEq/L (ref 3.5–5.1)
Sodium: 139 mEq/L (ref 135–145)

## 2011-03-18 LAB — CBC
MCHC: 33.7 g/dL (ref 30.0–36.0)
RDW: 14.5 % (ref 11.5–15.5)

## 2011-03-18 NOTE — Patient Instructions (Addendum)
YOUR PROCEDURE IS SCHEDULED ON:03/23/11  ENTER THROUGH THE MAIN ENTRANCE OF [facility] AT:3:00pm  USE DESK PHONE AND DIAL [phone] TO INFORM us OF YOUR ARRIVAL  CALL [phone] IF YOU HAVE ANY QUESTIONS OR PROBLEMS PRIOR TO YOUR ARRIVAL.  REMEMBER: DO NOT EAT  AFTER MIDNIGHT : Sunday  SPECIAL INSTRUCTIONS:clear liquids until 1230 pm Monday  YOU MAY BRUSH YOUR TEETH THE MORNING OF SURGERY   TAKE THESE MEDICINES THE DAY OF SURGERY WITH SIP OF WATER:BP meds   DO NOT WEAR JEWELRY, EYE MAKEUP, LIPSTICK OR DARK FINGERNAIL POLISH DO NOT WEAR LOTIONS OR DEODORANT DO NOT SHAVE FOR 48 HOURS PRIOR TO SURGERY  YOU WILL NOT BE ALLOWED TO DRIVE YOURSELF HOME.  NAME OF DRIVER:Daniel

## 2011-03-23 ENCOUNTER — Other Ambulatory Visit: Payer: Self-pay | Admitting: Obstetrics & Gynecology

## 2011-03-23 ENCOUNTER — Ambulatory Visit (HOSPITAL_COMMUNITY): Payer: Self-pay | Admitting: Anesthesiology

## 2011-03-23 ENCOUNTER — Encounter (HOSPITAL_COMMUNITY): Payer: Self-pay | Admitting: Anesthesiology

## 2011-03-23 ENCOUNTER — Encounter (HOSPITAL_COMMUNITY): Payer: Self-pay | Admitting: *Deleted

## 2011-03-23 ENCOUNTER — Encounter (HOSPITAL_COMMUNITY)
Admission: RE | Disposition: A | Discharge status: Home / Self Care | Payer: Self-pay | Source: Ambulatory Visit | Attending: Obstetrics & Gynecology

## 2011-03-23 ENCOUNTER — Ambulatory Visit (HOSPITAL_COMMUNITY)
Admission: RE | Admit: 2011-03-23 | Discharge: 2011-03-23 | Disposition: A | Discharge status: Home / Self Care | Payer: Self-pay | Source: Ambulatory Visit | Attending: Obstetrics & Gynecology | Admitting: Obstetrics & Gynecology

## 2011-03-23 DIAGNOSIS — N857 Hematometra: Secondary | ICD-10-CM | POA: Insufficient documentation

## 2011-03-23 HISTORY — PX: DILATION AND CURETTAGE OF UTERUS: SHX78

## 2011-03-23 SURGERY — DILATION AND CURETTAGE
Anesthesia: General | Site: Uterus | Wound class: Clean Contaminated

## 2011-03-23 MED ORDER — PROPOFOL 10 MG/ML IV EMUL
INTRAVENOUS | Status: AC
  Filled 2011-03-23: qty 20

## 2011-03-23 MED ORDER — DEXTROSE IN LACTATED RINGERS 5 % IV SOLN: INTRAVENOUS | Status: DC

## 2011-03-23 MED ORDER — MIDAZOLAM HCL 5 MG/5ML IJ SOLN
INTRAMUSCULAR | Status: DC | PRN
  Administered 2011-03-23: 2 mg via INTRAVENOUS

## 2011-03-23 MED ORDER — HYDROCODONE-ACETAMINOPHEN 5-500 MG PO TABS: 1.0000 | ORAL_TABLET | Freq: Four times a day (QID) | ORAL | Status: AC | PRN

## 2011-03-23 MED ORDER — FENTANYL CITRATE 0.05 MG/ML IJ SOLN
INTRAMUSCULAR | Status: DC | PRN
  Administered 2011-03-23 (×2): 50 ug via INTRAVENOUS

## 2011-03-23 MED ORDER — KETOROLAC TROMETHAMINE 30 MG/ML IJ SOLN
INTRAMUSCULAR | Status: AC
  Filled 2011-03-23: qty 1

## 2011-03-23 MED ORDER — ONDANSETRON HCL 4 MG/2ML IJ SOLN
INTRAMUSCULAR | Status: DC | PRN
  Administered 2011-03-23: 4 mg via INTRAVENOUS

## 2011-03-23 MED ORDER — PROPOFOL 10 MG/ML IV EMUL
INTRAVENOUS | Status: DC | PRN
  Administered 2011-03-23: 200 mg via INTRAVENOUS

## 2011-03-23 MED ORDER — KETOROLAC TROMETHAMINE 30 MG/ML IJ SOLN
INTRAMUSCULAR | Status: DC | PRN
  Administered 2011-03-23: 30 mg via INTRAVENOUS

## 2011-03-23 MED ORDER — ONDANSETRON HCL 4 MG/2ML IJ SOLN
INTRAMUSCULAR | Status: AC
  Filled 2011-03-23: qty 2

## 2011-03-23 MED ORDER — FENTANYL CITRATE 0.05 MG/ML IJ SOLN
25.0000 ug | INTRAMUSCULAR | Status: DC | PRN
  Administered 2011-03-23: 50 ug via INTRAVENOUS

## 2011-03-23 MED ORDER — LIDOCAINE HCL (CARDIAC) 20 MG/ML IV SOLN
INTRAVENOUS | Status: DC | PRN
  Administered 2011-03-23: 50 mg via INTRAVENOUS

## 2011-03-23 MED ORDER — HYDROCODONE-ACETAMINOPHEN 5-325 MG PO TABS
ORAL_TABLET | ORAL | Status: AC
  Administered 2011-03-23: 1 via ORAL
  Filled 2011-03-23: qty 1

## 2011-03-23 MED ORDER — ACETAMINOPHEN 325 MG PO TABS
325.0000 mg | ORAL_TABLET | ORAL | Status: DC | PRN
Start: 2011-03-23 — End: 2011-03-23

## 2011-03-23 MED ORDER — LACTATED RINGERS IV SOLN
INTRAVENOUS | Status: DC
  Administered 2011-03-23: 15:00:00 via INTRAVENOUS

## 2011-03-23 MED ORDER — KETOROLAC TROMETHAMINE 30 MG/ML IJ SOLN
15.0000 mg | Freq: Once | INTRAMUSCULAR | Status: DC | PRN
Start: 2011-03-23 — End: 2011-03-23

## 2011-03-23 MED ORDER — PROMETHAZINE HCL 25 MG/ML IJ SOLN: 6.2500 mg | INTRAMUSCULAR | Status: DC | PRN

## 2011-03-23 MED ORDER — IBUPROFEN 200 MG PO TABS: 800.0000 mg | ORAL_TABLET | Freq: Three times a day (TID) | ORAL | Status: DC | PRN

## 2011-03-23 MED ORDER — FENTANYL CITRATE 0.05 MG/ML IJ SOLN
INTRAMUSCULAR | Status: AC
  Filled 2011-03-23: qty 2

## 2011-03-23 MED ORDER — LIDOCAINE HCL (CARDIAC) 20 MG/ML IV SOLN
INTRAVENOUS | Status: AC
  Filled 2011-03-23: qty 5

## 2011-03-23 MED ORDER — MIDAZOLAM HCL 2 MG/2ML IJ SOLN
INTRAMUSCULAR | Status: AC
  Filled 2011-03-23: qty 2

## 2011-03-23 SURGICAL SUPPLY — 11 items
CATH ROBINSON RED A/P 16FR (CATHETERS) ×2 IMPLANT
CLOTH BEACON ORANGE TIMEOUT ST (SAFETY) ×2 IMPLANT
CONTAINER PREFILL 10% NBF 60ML (FORM) ×2 IMPLANT
DILATOR CANAL MILEX (MISCELLANEOUS) IMPLANT
GLOVE BIO SURGEON STRL SZ 6.5 (GLOVE) ×4 IMPLANT
GOWN PREVENTION PLUS LG XLONG (DISPOSABLE) ×4 IMPLANT
NEEDLE SPNL 18GX3.5 QUINCKE PK (NEEDLE) ×2 IMPLANT
PACK VAGINAL MINOR WOMEN LF (CUSTOM PROCEDURE TRAY) ×2 IMPLANT
PAD PREP 24X48 CUFFED NSTRL (MISCELLANEOUS) ×2 IMPLANT
SYR CONTROL 10ML LL (SYRINGE) ×2 IMPLANT
TOWEL OR 17X24 6PK STRL BLUE (TOWEL DISPOSABLE) ×4 IMPLANT

## 2011-03-23 NOTE — H&P (Signed)
Shirley Hernandez is an 40 y.o. female.She was seen in the MAU 10-12 due to pelvic pain. An ultrasound showed a hematometrium and a stenotic cervix was noted on exam.  Pertinent Gynecological History: Menses: flow is light Bleeding: intermenstrual bleeding Contraception: none DES exposure: denies Blood transfusions: none Sexually transmitted diseases: no past history Previous GYN Procedures: DNC  Last mammogram: normal Date: 2010 Last pap: normal Date: 11/12 OB History: G5, P3 A2   Menstrual History: Menarche age: 55 Patient's last menstrual period was 02/27/2011.    Past Medical History  Diagnosis Date  . Hypertension   . Headache     Past Surgical History  Procedure Date  . Dilation and curettage of uterus     Family History  Problem Relation Age of Onset  . Depression Mother   . Diabetes Mother   . Hypertension Mother   . Stroke Mother   . Hypertension Father     Social History:  reports that she has been smoking.  She does not have any smokeless tobacco history on file. She reports that she drinks alcohol. She reports that she does not use illicit drugs.  Allergies: No Known Allergies  Prescriptions prior to admission  Medication Sig Dispense Refill  . HYDROcodone-acetaminophen (VICODIN) 5-500 MG per tablet Take 1 tablet by mouth every 6 (six) hours as needed.  30 tablet  0  . ibuprofen (ADVIL,MOTRIN) 200 MG tablet Take 200 mg by mouth every 6 (six) hours as needed. For pain       . lisinopril-hydrochlorothiazide (PRINZIDE,ZESTORETIC) 20-25 MG per tablet Take 1 tablet by mouth daily.  90 tablet  2  . metoprolol succinate (TOPROL-XL) 25 MG 24 hr tablet Take 1 tablet (25 mg total) by mouth daily.  90 tablet  2  . misoprostol (CYTOTEC) 200 MCG tablet Take 4 200 mcg tablets the night before surgery.   4 tablet  0    ROS  Blood pressure 139/100, pulse 79, temperature 98.1 F (36.7 C), temperature source Oral, resp. rate 16, height 5\' 5"  (1.651 m), weight 92.987 kg  (205 lb), last menstrual period 02/27/2011, SpO2 100.00%. Physical Exam Heart- rrr Lungs-CTAB Abd- benign  Results for orders placed during the hospital encounter of 03/23/11 (from the past 24 hour(s))  PREGNANCY, URINE     Status: Normal   Collection Time   03/23/11  2:57 PM      Component Value Range   Preg Test, Ur NEGATIVE      No results found.  Assessment/Plan: hematometrium- plan for dilation and curretage. She took cytotec last night.  Gerrit Rafalski C. 03/23/2011, 3:23 PM

## 2011-03-23 NOTE — Transfer of Care (Signed)
Immediate Anesthesia Transfer of Care Note  Patient: Shirley Hernandez  Procedure(s) Performed:  DILATATION AND CURETTAGE  Patient Location: PACU  Anesthesia Type: General  Level of Consciousness: awake, alert  and oriented  Airway & Oxygen Therapy: Patient Spontanous Breathing and Patient connected to nasal cannula oxygen  Post-op Assessment: Report given to PACU RN and Post -op Vital signs reviewed and stable  Post vital signs: Reviewed and stable  Complications: No apparent anesthesia complications

## 2011-03-23 NOTE — Anesthesia Postprocedure Evaluation (Signed)
Anesthesia Post Note  Patient: Shirley Hernandez  Procedure(s) Performed:  DILATATION AND CURETTAGE  Anesthesia type: GA  Patient location: PACU  Post pain: Pain level controlled  Post assessment: Post-op Vital signs reviewed  Last Vitals:  Filed Vitals:   03/23/11 1615  BP: 133/94  Pulse: 80  Temp: 36.3 C  Resp: 16    Post vital signs: Reviewed  Level of consciousness: sedated  Complications: No apparent anesthesia complications

## 2011-03-23 NOTE — Op Note (Signed)
03/23/2011  4:02 PM  PATIENT:  Shirley Hernandez  40 y.o. female  PRE-OPERATIVE DIAGNOSIS:  Hematometria  POST-OPERATIVE DIAGNOSIS:  Hematometria  PROCEDURE:  Procedure(s): DILATATION AND CURETTAGE  SURGEON:  Surgeon(s): Grazia Taffe C. Marice Potter, MD  PHYSICIAN ASSISTANT:   ASSISTANTS: none   ANESTHESIA:   general  EBL:  Total I/O In: -  Out: 100 [Urine:50; Blood:50]  BLOOD ADMINISTERED:none  DRAINS: none   LOCAL MEDICATIONS USED:  NONE  SPECIMEN:  Source of Specimen:  uterine currettings  DISPOSITION OF SPECIMEN:  PATHOLOGY  COUNTS:  YES  TOURNIQUET:  * No tourniquets in log *  DICTATION: .Dragon Dictation  PLAN OF CARE: Discharge to home after PACU  PATIENT DISPOSITION:  PACU - hemodynamically stable.   Delay start of Pharmacological VTE agent (>24hrs) due to surgical blood loss or risk of bleeding:  No  The risks, benefits, and alternatives were explained, understood, and accepted. Consents were signed. In the operating room, general anesthesia was applied without complication. The was placed in the dorsal lithotomy position. Her vagina was prepped and draped in the usual sterile fashion. Her bladder was emptied with a Robinson catheter. A speculem was placed and the anterior lip of her cervix was grasped with a single tooth tenaculem. Her stenotic cervix was carefull dilated to accomodate a small currette. Curretage was done. A large amount of old brown smelly blood was noted along with a small amount of endometrium. The tenaculem was removed and the site was hemostatic. She was extubated and taken to the recovery room. She tolerated the procedure well.

## 2011-03-23 NOTE — Anesthesia Preprocedure Evaluation (Signed)
Anesthesia Evaluation  Patient identified by MRN, date of birth, ID band Patient awake    Reviewed: Allergy & Precautions, H&P , Patient's Chart, lab work & pertinent test results, reviewed documented beta blocker date and time   History of Anesthesia Complications Negative for: history of anesthetic complications  Airway Mallampati: II TM Distance: >3 FB Neck ROM: full    Dental No notable dental hx.    Pulmonary neg pulmonary ROS,  clear to auscultation  Pulmonary exam normal       Cardiovascular Exercise Tolerance: Good hypertension, neg cardio ROS regular Normal    Neuro/Psych  Headaches, Negative Neurological ROS  Negative Psych ROS   GI/Hepatic negative GI ROS, Neg liver ROS,   Endo/Other  Negative Endocrine ROS  Renal/GU negative Renal ROS     Musculoskeletal   Abdominal   Peds  Hematology negative hematology ROS (+)   Anesthesia Other Findings   Reproductive/Obstetrics negative OB ROS                           Anesthesia Physical Anesthesia Plan  ASA: II  Anesthesia Plan: General LMA   Post-op Pain Management:    Induction:   Airway Management Planned:   Additional Equipment:   Intra-op Plan:   Post-operative Plan:   Informed Consent: I have reviewed the patients History and Physical, chart, labs and discussed the procedure including the risks, benefits and alternatives for the proposed anesthesia with the patient or authorized representative who has indicated his/her understanding and acceptance.   Dental Advisory Given  Plan Discussed with: CRNA, Surgeon and Anesthesiologist  Anesthesia Plan Comments:         Anesthesia Quick Evaluation

## 2011-03-25 ENCOUNTER — Encounter (HOSPITAL_COMMUNITY): Payer: Self-pay | Admitting: Obstetrics & Gynecology

## 2011-04-01 ENCOUNTER — Ambulatory Visit: Payer: Self-pay | Admitting: Obstetrics and Gynecology

## 2011-04-07 ENCOUNTER — Other Ambulatory Visit: Payer: Self-pay

## 2011-04-07 ENCOUNTER — Emergency Department (HOSPITAL_COMMUNITY)
Admission: EM | Admit: 2011-04-07 | Discharge: 2011-04-07 | Disposition: A | Discharge status: Home / Self Care | Payer: Self-pay | Attending: Emergency Medicine | Admitting: Emergency Medicine

## 2011-04-07 ENCOUNTER — Telehealth: Payer: Self-pay | Admitting: *Deleted

## 2011-04-07 ENCOUNTER — Encounter (HOSPITAL_COMMUNITY): Payer: Self-pay | Admitting: *Deleted

## 2011-04-07 DIAGNOSIS — Z79899 Other long term (current) drug therapy: Secondary | ICD-10-CM | POA: Insufficient documentation

## 2011-04-07 DIAGNOSIS — M542 Cervicalgia: Secondary | ICD-10-CM | POA: Insufficient documentation

## 2011-04-07 DIAGNOSIS — R209 Unspecified disturbances of skin sensation: Secondary | ICD-10-CM | POA: Insufficient documentation

## 2011-04-07 DIAGNOSIS — I10 Essential (primary) hypertension: Secondary | ICD-10-CM | POA: Insufficient documentation

## 2011-04-07 DIAGNOSIS — R200 Anesthesia of skin: Secondary | ICD-10-CM

## 2011-04-07 MED ORDER — LISINOPRIL-HYDROCHLOROTHIAZIDE 20-25 MG PO TABS: 1.0000 | ORAL_TABLET | Freq: Every day | ORAL | Status: DC

## 2011-04-07 MED ORDER — METOPROLOL SUCCINATE ER 25 MG PO TB24: 25.0000 mg | ORAL_TABLET | Freq: Every day | ORAL | Status: DC

## 2011-04-07 MED ORDER — PREDNISONE 20 MG PO TABS: 20.0000 mg | ORAL_TABLET | Freq: Two times a day (BID) | ORAL | Status: DC

## 2011-04-07 NOTE — Telephone Encounter (Signed)
Received call from pharmacist @ [facility] stating that they had a Rx for Vicodin for pt which is unsigned. She is calling to verify the validity of Rx and stated that it must be signed. I returned the call and spoke with a female pharmacist who stated that the Rx is dated 03/23/11. I asked him to fax the Rx to me so that I may review it for accuracy/validity.

## 2011-04-07 NOTE — ED Notes (Signed)
The pt has not taken her bp meds for over a week

## 2011-04-07 NOTE — ED Notes (Signed)
Presents with c/o right arm numbness onset yesterday

## 2011-04-07 NOTE — ED Notes (Signed)
The pt is c/o some rt arm numbness and bi-lateral hand numbness since last pm intermittently.  No pain no previous history

## 2011-04-07 NOTE — ED Provider Notes (Addendum)
History     CSN: [id number]  Arrival date & time 04/07/11  0046   First MD Initiated Contact with Patient 04/07/11 0254      Chief Complaint  Patient presents with  . Numbness    (Consider location/radiation/quality/duration/timing/severity/associated sxs/prior treatment) HPI Shirley Hernandez is a 41 y.o. female presents with c/o  neck pain, and right hand numbness  leading to desire to be assessed in the ED. The sx(s) have been present for  2 days . Additional concerns are ran out of blood pressure medicine 5 days ago . Causative factors are nothing. Palliative factors are  ibuprofen helps the discomfort . The distress associated is  mild . The disorder has been present for  2 days . Marland Kitchen SHE DENIES WEAKNESS. DIZZINESS, NAUSEA, OR VOMITING    Past Medical History  Diagnosis Date  . Hypertension   . Headache     Past Surgical History  Procedure Date  . Dilation and curettage of uterus   . Dilation and curettage of uterus 03/23/2011    Procedure: DILATATION AND CURETTAGE;  Surgeon: Myra C. Marice Potter, MD;  Location: [facility];  Service: Gynecology;  Laterality: N/A;    Family History  Problem Relation Age of Onset  . Depression Mother   . Diabetes Mother   . Hypertension Mother   . Stroke Mother   . Hypertension Father     History  Substance Use Topics  . Smoking status: Current Some Day Smoker -- 0.5 packs/day  . Smokeless tobacco: Not on file  . Alcohol Use: Yes     socially    OB History    Grav Para Term Preterm Abortions TAB SAB Ect Mult Living   5 3 3  2  2   3       Review of Systems  All other systems reviewed and are negative.    Allergies  Review of patient's allergies indicates no known allergies.  Home Medications   Current Outpatient Rx  Name Route Sig Dispense Refill  . HYDROCODONE-ACETAMINOPHEN 5-500 MG PO TABS Oral Take 1 tablet by mouth every 6 (six) hours as needed. 30 tablet 0  . IBUPROFEN 200 MG PO TABS Oral Take 4 tablets (800 mg total) by mouth  every 8 (eight) hours as needed. For pain 60 tablet 1  . LISINOPRIL-HYDROCHLOROTHIAZIDE 20-25 MG PO TABS Oral Take 1 tablet by mouth daily. 90 tablet 2  . METOPROLOL SUCCINATE ER 25 MG PO TB24 Oral Take 1 tablet (25 mg total) by mouth daily. 90 tablet 2  . LISINOPRIL-HYDROCHLOROTHIAZIDE 20-25 MG PO TABS Oral Take 1 tablet by mouth daily. 30 tablet 0  . METOPROLOL SUCCINATE ER 25 MG PO TB24 Oral Take 1 tablet (25 mg total) by mouth daily. 30 tablet 0  . PREDNISONE 20 MG PO TABS Oral Take 1 tablet (20 mg total) by mouth 2 (two) times daily. 10 tablet 0    BP 140/117  Pulse 101  Temp(Src) 98.4 F (36.9 C) (Oral)  Resp 23  SpO2 97%  LMP 04/07/2011  Physical Exam  Nursing note and vitals reviewed. Constitutional: She is oriented to person, place, and time. She appears well-developed and well-nourished.  HENT:  Head: Normocephalic and atraumatic.  Eyes: Pupils are equal, round, and reactive to light.  Neck: Normal range of motion and phonation normal. Neck supple.  Pulmonary/Chest: Effort normal. She exhibits no tenderness.  Musculoskeletal: Normal range of motion.       No spine tenderness  Neurological: She is alert  and oriented to person, place, and time. She has normal strength. No cranial nerve deficit. Coordination normal.       She has very mild decreased light sensation to the right hand. Normal strength  Right hand.  Skin: Skin is warm and dry.  Psychiatric: She has a normal mood and affect. Her behavior is normal. Judgment and thought content normal.    ED Course  Procedures (including critical care time)   Date: 04/07/2011  Rate: 105  Rhythm: sinus tachycardia  QRS Axis: normal  Intervals: normal  ST/T Wave abnormalities: normal  Conduction Disutrbances:none  Narrative Interpretation: rate faster today  Old EKG Reviewed: changes noted    Labs Reviewed - No data to display No results found.   1. Neck pain   2. Numbness   3. 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 Patient has normal strength in the arms and legs bilaterally. She walks with a normal gait. She has incidental hypertension related to not taking her blood pressure medicine for 5 days. Her prescriptions are refilled        Flint Melter, MD 04/07/11 8469  Flint Melter, MD 04/07/11 [phone]

## 2011-04-08 NOTE — Telephone Encounter (Signed)
Copy of Rx received late yesterday afternoon. Dr. Marice Potter requested to see it this morning for validation. When shown to her, she stated that the pt had come in to MAU last night and she gave new Rx to pt. Therefore, Rx which was written originally on 03/23/11 will not be validated for pt to obtain.

## 2011-04-09 ENCOUNTER — Emergency Department (HOSPITAL_COMMUNITY)
Admission: EM | Admit: 2011-04-09 | Discharge: 2011-04-09 | Disposition: A | Discharge status: Home / Self Care | Payer: Self-pay | Attending: Emergency Medicine | Admitting: Emergency Medicine

## 2011-04-09 ENCOUNTER — Encounter (HOSPITAL_COMMUNITY): Payer: Self-pay

## 2011-04-09 ENCOUNTER — Emergency Department (HOSPITAL_COMMUNITY): Payer: Self-pay

## 2011-04-09 ENCOUNTER — Other Ambulatory Visit: Payer: Self-pay

## 2011-04-09 DIAGNOSIS — R51 Headache: Secondary | ICD-10-CM | POA: Insufficient documentation

## 2011-04-09 DIAGNOSIS — R0602 Shortness of breath: Secondary | ICD-10-CM | POA: Insufficient documentation

## 2011-04-09 DIAGNOSIS — H538 Other visual disturbances: Secondary | ICD-10-CM | POA: Insufficient documentation

## 2011-04-09 DIAGNOSIS — F17200 Nicotine dependence, unspecified, uncomplicated: Secondary | ICD-10-CM | POA: Insufficient documentation

## 2011-04-09 DIAGNOSIS — Z79899 Other long term (current) drug therapy: Secondary | ICD-10-CM | POA: Insufficient documentation

## 2011-04-09 DIAGNOSIS — R200 Anesthesia of skin: Secondary | ICD-10-CM

## 2011-04-09 DIAGNOSIS — I10 Essential (primary) hypertension: Secondary | ICD-10-CM | POA: Insufficient documentation

## 2011-04-09 DIAGNOSIS — R209 Unspecified disturbances of skin sensation: Secondary | ICD-10-CM | POA: Insufficient documentation

## 2011-04-09 LAB — CBC
Hemoglobin: 13.6 g/dL (ref 12.0–15.0)
MCV: 85.4 fL (ref 78.0–100.0)
Platelets: 177 10*3/uL (ref 150–400)
RBC: 4.73 MIL/uL (ref 3.87–5.11)
WBC: 4.4 10*3/uL (ref 4.0–10.5)

## 2011-04-09 LAB — BASIC METABOLIC PANEL
BUN: 21 mg/dL (ref 6–23)
CO2: 26 mEq/L (ref 19–32)
Glucose, Bld: 134 mg/dL — ABNORMAL HIGH (ref 70–99)
Potassium: 4.2 mEq/L (ref 3.5–5.1)
Sodium: 140 mEq/L (ref 135–145)

## 2011-04-09 LAB — URINE MICROSCOPIC-ADD ON

## 2011-04-09 LAB — URINALYSIS, ROUTINE W REFLEX MICROSCOPIC
Glucose, UA: NEGATIVE mg/dL
pH: 5.5 (ref 5.0–8.0)

## 2011-04-09 LAB — DIFFERENTIAL
Eosinophils Relative: 0 % (ref 0–5)
Lymphocytes Relative: 20 % (ref 12–46)
Lymphs Abs: 0.9 10*3/uL (ref 0.7–4.0)
Monocytes Relative: 11 % (ref 3–12)
Neutrophils Relative %: 69 % (ref 43–77)

## 2011-04-09 NOTE — ED Provider Notes (Signed)
History     CSN: [id number]  Arrival date & time 04/09/11  1039   First MD Initiated Contact with Patient 04/09/11 1100      Chief Complaint  Patient presents with  . Shortness of Breath  . Blurred Vision    (Consider location/radiation/quality/duration/timing/severity/associated sxs/prior treatment) HPI Comments: Seen here two days ago and diagnosed with cervical radiculopathy and had anti-hypertensives refilled.  Returns today complaining of numbness in the right arm and a strange feeling in her head.  She also complains of blurred vision.    Patient is a 41 y.o. female presenting with shortness of breath. The history is provided by the patient.  Shortness of Breath  The current episode started 2 days ago. The problem occurs continuously. The problem has been gradually worsening. The problem is moderate. The symptoms are relieved by nothing. The symptoms are aggravated by nothing. Associated symptoms include shortness of breath. Pertinent negatives include no chest pain and no fever. Urine output has been normal.    Past Medical History  Diagnosis Date  . Hypertension   . Headache     Past Surgical History  Procedure Date  . Dilation and curettage of uterus   . Dilation and curettage of uterus 03/23/2011    Procedure: DILATATION AND CURETTAGE;  Surgeon: Myra C. Marice Potter, MD;  Location: [facility];  Service: Gynecology;  Laterality: N/A;    Family History  Problem Relation Age of Onset  . Depression Mother   . Diabetes Mother   . Hypertension Mother   . Stroke Mother   . Hypertension Father     History  Substance Use Topics  . Smoking status: Current Some Day Smoker -- 0.5 packs/day  . Smokeless tobacco: Not on file  . Alcohol Use: Yes     socially    OB History    Grav Para Term Preterm Abortions TAB SAB Ect Mult Living   5 3 3  2  2   3       Review of Systems  Constitutional: Negative for fever, chills and activity change.  Respiratory: Positive for shortness  of breath.   Cardiovascular: Negative for chest pain.  All other systems reviewed and are negative.    Allergies  Review of patient's allergies indicates no known allergies.  Home Medications   Current Outpatient Rx  Name Route Sig Dispense Refill  . HYDROCODONE-ACETAMINOPHEN 5-500 MG PO TABS Oral Take 1 tablet by mouth every 6 (six) hours as needed. 30 tablet 0  . IBUPROFEN 200 MG PO TABS Oral Take 4 tablets (800 mg total) by mouth every 8 (eight) hours as needed. For pain 60 tablet 1  . LISINOPRIL-HYDROCHLOROTHIAZIDE 20-25 MG PO TABS Oral Take 1 tablet by mouth daily. 90 tablet 2  . METOPROLOL SUCCINATE ER 25 MG PO TB24 Oral Take 1 tablet (25 mg total) by mouth daily. 30 tablet 0  . PREDNISONE 20 MG PO TABS Oral Take 1 tablet (20 mg total) by mouth 2 (two) times daily. 10 tablet 0    BP 138/95  Temp(Src) 98 F (36.7 C) (Oral)  Resp 18  Ht 5\' 6"  (1.676 m)  Wt 204 lb (92.534 kg)  BMI 32.93 kg/m2  SpO2 99%  LMP 04/07/2011  Physical Exam  Constitutional: She is oriented to person, place, and time. She appears well-developed and well-nourished. No distress.  HENT:  Head: Normocephalic and atraumatic.  Right Ear: External ear normal.  Left Ear: External ear normal.  Neck: Normal range of motion. Neck supple.  Cardiovascular: Normal rate and regular rhythm.   No murmur heard. Pulmonary/Chest: Effort normal and breath sounds normal. No respiratory distress. She has no wheezes.  Abdominal: Soft. Bowel sounds are normal. She exhibits no distension. There is no tenderness.  Musculoskeletal: Normal range of motion. She exhibits no edema.  Neurological: She is alert and oriented to person, place, and time. She displays normal reflexes. No cranial nerve deficit. She exhibits normal muscle tone. Coordination normal.  Skin: Skin is warm and dry. She is not diaphoretic.    ED Course  Procedures (including critical care time)   Labs Reviewed  CBC  DIFFERENTIAL  BASIC METABOLIC  PANEL  TROPONIN I   No results found.   No diagnosis found.    MDM  Symptoms are difficult to explain but does not appear to be anything serious.  Ct is okay, labs and ekg are unremarkable.  Will discharge to home with follow up prn.        Geoffery Lyons, MD 04/09/11 1253

## 2011-04-09 NOTE — ED Notes (Signed)
Pt reports she continues to have high BP, blurred vision and SOB, pt reports a strange feeling in her head,

## 2011-04-09 NOTE — ED Notes (Signed)
Patient transported to CT 

## 2011-04-12 ENCOUNTER — Emergency Department (HOSPITAL_COMMUNITY)
Admission: EM | Admit: 2011-04-12 | Discharge: 2011-04-12 | Disposition: A | Discharge status: Home / Self Care | Payer: Self-pay | Attending: Emergency Medicine | Admitting: Emergency Medicine

## 2011-04-12 ENCOUNTER — Encounter (HOSPITAL_COMMUNITY): Payer: Self-pay | Admitting: *Deleted

## 2011-04-12 ENCOUNTER — Other Ambulatory Visit: Payer: Self-pay

## 2011-04-12 DIAGNOSIS — R42 Dizziness and giddiness: Secondary | ICD-10-CM | POA: Insufficient documentation

## 2011-04-12 DIAGNOSIS — I10 Essential (primary) hypertension: Secondary | ICD-10-CM | POA: Insufficient documentation

## 2011-04-12 DIAGNOSIS — R7309 Other abnormal glucose: Secondary | ICD-10-CM | POA: Insufficient documentation

## 2011-04-12 DIAGNOSIS — R631 Polydipsia: Secondary | ICD-10-CM | POA: Insufficient documentation

## 2011-04-12 DIAGNOSIS — R739 Hyperglycemia, unspecified: Secondary | ICD-10-CM

## 2011-04-12 DIAGNOSIS — Z79899 Other long term (current) drug therapy: Secondary | ICD-10-CM | POA: Insufficient documentation

## 2011-04-12 DIAGNOSIS — R350 Frequency of micturition: Secondary | ICD-10-CM | POA: Insufficient documentation

## 2011-04-12 LAB — DIFFERENTIAL
Basophils Relative: 1 % (ref 0–1)
Eosinophils Absolute: 0 10*3/uL (ref 0.0–0.7)
Monocytes Relative: 14 % — ABNORMAL HIGH (ref 3–12)
Neutrophils Relative %: 32 % — ABNORMAL LOW (ref 43–77)

## 2011-04-12 LAB — POCT I-STAT TROPONIN I: Troponin i, poc: 0 ng/mL (ref 0.00–0.08)

## 2011-04-12 LAB — CBC
MCH: 29.5 pg (ref 26.0–34.0)
MCHC: 33.9 g/dL (ref 30.0–36.0)
Platelets: 170 10*3/uL (ref 150–400)
RDW: 14 % (ref 11.5–15.5)

## 2011-04-12 LAB — BASIC METABOLIC PANEL
BUN: 14 mg/dL (ref 6–23)
GFR calc Af Amer: >90 mL/min (ref >90)
GFR calc non Af Amer: 80 mL/min — ABNORMAL LOW (ref >90)
Potassium: 3.7 mEq/L (ref 3.5–5.1)
Sodium: 137 mEq/L (ref 135–145)

## 2011-04-12 LAB — URINALYSIS, ROUTINE W REFLEX MICROSCOPIC
Bilirubin Urine: NEGATIVE
Nitrite: NEGATIVE
Specific Gravity, Urine: 1.013 (ref 1.005–1.030)
Urobilinogen, UA: 0.2 mg/dL (ref 0.0–1.0)

## 2011-04-12 MED ORDER — SODIUM CHLORIDE 0.9 % IV BOLUS (SEPSIS)
1000.0000 mL | Freq: Once | INTRAVENOUS | Status: AC
  Administered 2011-04-12: 1000 mL via INTRAVENOUS

## 2011-04-12 NOTE — ED Notes (Addendum)
Pt reports that she just hasn't been feeling well for several days. States she has been several times for the same symptoms. States she feels light headed. States she took her blood sugar at her moms and it was 234 with no hx of diabetes.

## 2011-04-12 NOTE — ED Notes (Signed)
Pt reports, "This is my fourth time coming here." "I talked to my mom about my symptoms." "She has history of high blood pressure and diabetes." "She told me to check my sugar because my symptoms are the same as someone with high blood sugar." Pt denies history of diabetes.

## 2011-04-12 NOTE — ED Provider Notes (Signed)
History     CSN: [id number]  Arrival date & time 04/12/11  1038   First MD Initiated Contact with Patient 04/12/11 1108     Cc: light headedness and high blood sugar.  Reports that she has had some light headedness, thirst and heavy urination x 2 days.  She took her sugar and it was 234.  Family history of diabetes.  She has been to the er this week with other complaints such as headaches and numbness with SOB.  Denies those symptoms today.    CT of the head normal 3 days ago. Denies chest pain or SOB.   States that she ate a whole bag of reeces cups last week.  Her mother has an accucheck machine and she checked her blood.  Sitting up in bed with no apparent distress noted.  B/p115/83 today.   Chief Complaint  Patient presents with  . Dizziness    (Consider location/radiation/quality/duration/timing/severity/associated sxs/prior treatment) The history is provided by the patient. No language interpreter was used.    Past Medical History  Diagnosis Date  . Hypertension   . Headache     Past Surgical History  Procedure Date  . Dilation and curettage of uterus   . Dilation and curettage of uterus 03/23/2011    Procedure: DILATATION AND CURETTAGE;  Surgeon: Myra C. Marice Potter, MD;  Location: [facility];  Service: Gynecology;  Laterality: N/A;    Family History  Problem Relation Age of Onset  . Depression Mother   . Diabetes Mother   . Hypertension Mother   . Stroke Mother   . Hypertension Father     History  Substance Use Topics  . Smoking status: Current Some Day Smoker -- 0.5 packs/day  . Smokeless tobacco: Not on file  . Alcohol Use: Yes     socially    OB History    Grav Para Term Preterm Abortions TAB SAB Ect Mult Living   5 3 3  2  2   3       Review of Systems  Constitutional: Negative for fever, chills, diaphoresis and fatigue.  HENT: Negative.   Respiratory: Negative for cough, chest tightness, shortness of breath and wheezing.   Cardiovascular: Negative for chest  pain, palpitations and leg swelling.  Gastrointestinal: Negative.   Skin: Negative.   Neurological: Positive for light-headedness. Negative for tremors, weakness, numbness and headaches.  Psychiatric/Behavioral: Negative.   All other systems reviewed and are negative.    Allergies  Review of patient's allergies indicates no known allergies.  Home Medications   Current Outpatient Rx  Name Route Sig Dispense Refill  . ACETAMINOPHEN 500 MG PO TABS Oral Take 1,000 mg by mouth every 6 (six) hours as needed. For pain.    Marland Kitchen HYDROCODONE-ACETAMINOPHEN 5-500 MG PO TABS Oral Take 1 tablet by mouth every 6 (six) hours as needed. For pain.    . IBUPROFEN 200 MG PO TABS Oral Take 4 tablets (800 mg total) by mouth every 8 (eight) hours as needed. For pain 60 tablet 1  . LISINOPRIL-HYDROCHLOROTHIAZIDE 20-25 MG PO TABS Oral Take 1 tablet by mouth daily. 90 tablet 2  . METOPROLOL SUCCINATE ER 25 MG PO TB24 Oral Take 25 mg by mouth daily.    Marland Kitchen PREDNISONE 20 MG PO TABS Oral Take 20 mg by mouth 2 (two) times daily. Started on 1/15 for 5 days.      BP 115/83  Pulse 93  Temp(Src) 98.5 F (36.9 C) (Oral)  Resp 18  SpO2 98%  LMP 04/07/2011  Physical Exam  Nursing note and vitals reviewed. Constitutional: She is oriented to person, place, and time. She appears well-developed and well-nourished. She appears distressed.  HENT:  Head: Normocephalic.  Right Ear: Tympanic membrane normal. No tenderness. Tympanic membrane is not erythematous and not bulging. No middle ear effusion.  Left Ear: Tympanic membrane normal. No tenderness. Tympanic membrane is not erythematous and not bulging.  No middle ear effusion.  Eyes: Pupils are equal, round, and reactive to light.  Neck: Normal range of motion.  Cardiovascular: Normal rate, regular rhythm, normal heart sounds and intact distal pulses.   No murmur heard. Pulmonary/Chest: Effort normal and breath sounds normal. No respiratory distress. She has no wheezes.    Abdominal: Soft. Bowel sounds are normal.  Musculoskeletal: Normal range of motion. She exhibits no edema.  Neurological: She is alert and oriented to person, place, and time.  Skin: Skin is warm and dry.  Psychiatric: She has a normal mood and affect.    ED Course  Procedures (including critical care time)  Labs Reviewed  DIFFERENTIAL - Abnormal; Notable for the following:    Neutrophils Relative 32 (*)    Neutro Abs 1.6 (*)    Lymphocytes Relative 52 (*)    Monocytes Relative 14 (*)    All other components within normal limits  BASIC METABOLIC PANEL - Abnormal; Notable for the following:    Glucose, Bld 168 (*)    GFR calc non Af Amer 80 (*)    All other components within normal limits  URINALYSIS, ROUTINE W REFLEX MICROSCOPIC - Abnormal; Notable for the following:    Hgb urine dipstick SMALL (*)    All other components within normal limits  CBC  PREGNANCY, URINE  POCT I-STAT TROPONIN I  URINE MICROSCOPIC-ADD ON  I-STAT TROPONIN I   No results found.   1. Hyperglycemia       MDM  C/o lightheadedness with polyuria and thirst.  Blood sugar pta was 234 per mothers cbg machine.  Sugar was 168 in the ER today.  Encouraged her to get follow up asap and have a fasting blood sugar.  Educated on diabetes diet and exercise.  Other labs normal.  EKG no changes.  Will keep a log of sugars at home and exercise with diabetic diet until she can see pcp to be worked up.          Jethro Bastos, NP 04/12/11 [place]

## 2011-04-13 ENCOUNTER — Encounter (HOSPITAL_COMMUNITY): Payer: Self-pay

## 2011-04-13 ENCOUNTER — Emergency Department (INDEPENDENT_AMBULATORY_CARE_PROVIDER_SITE_OTHER)
Admission: EM | Admit: 2011-04-13 | Discharge: 2011-04-13 | Disposition: A | Discharge status: Home / Self Care | Payer: Self-pay | Source: Home / Self Care | Attending: Emergency Medicine | Admitting: Emergency Medicine

## 2011-04-13 DIAGNOSIS — R739 Hyperglycemia, unspecified: Secondary | ICD-10-CM

## 2011-04-13 DIAGNOSIS — M542 Cervicalgia: Secondary | ICD-10-CM

## 2011-04-13 DIAGNOSIS — R7309 Other abnormal glucose: Secondary | ICD-10-CM

## 2011-04-13 LAB — GLUCOSE, CAPILLARY: Glucose-Capillary: 125 mg/dL — ABNORMAL HIGH (ref 70–99)

## 2011-04-13 MED ORDER — METFORMIN HCL 500 MG PO TABS: 500.0000 mg | ORAL_TABLET | Freq: Two times a day (BID) | ORAL | Status: DC

## 2011-04-13 NOTE — ED Notes (Signed)
C/o neck and low back pain.  Sx for 1 week.  Denies injury or strenuous activity- seen in ED previously last week for same

## 2011-04-13 NOTE — ED Provider Notes (Signed)
Medical screening examination/treatment/procedure(s) were performed by non-physician practitioner and as supervising physician I was immediately available for consultation/collaboration.   Zuleica Seith, MD 04/13/11 1240 

## 2011-04-13 NOTE — ED Provider Notes (Addendum)
History     CSN: [id number]  Arrival date & time 04/13/11  1030   First MD Initiated Contact with Patient 04/13/11 1107      Chief Complaint  Patient presents with  . Neck Pain  . Back Pain    (Consider location/radiation/quality/duration/timing/severity/associated sxs/prior treatment) Patient is a 41 y.o. female presenting with neck pain and back pain. The history is provided by the spouse.  Neck Pain  This is a recurrent problem. The current episode started more than 1 week ago. The problem has not changed since onset.The pain is associated with an unknown factor. There has been no fever. The pain is present in the generalized neck. The pain radiates to the left shoulder. The pain is at a severity of 7/10. The pain is moderate. The symptoms are aggravated by bending and position. Pertinent negatives include no photophobia, no numbness, no bladder incontinence and no tingling.  Back Pain  Pertinent negatives include no numbness, no bladder incontinence and no tingling.    Past Medical History  Diagnosis Date  . Hypertension   . Headache     Past Surgical History  Procedure Date  . Dilation and curettage of uterus   . Dilation and curettage of uterus 03/23/2011    Procedure: DILATATION AND CURETTAGE;  Surgeon: Myra C. Marice Potter, MD;  Location: [facility];  Service: Gynecology;  Laterality: N/A;    Family History  Problem Relation Age of Onset  . Depression Mother   . Diabetes Mother   . Hypertension Mother   . Stroke Mother   . Hypertension Father     History  Substance Use Topics  . Smoking status: Current Some Day Smoker -- 0.5 packs/day  . Smokeless tobacco: Not on file  . Alcohol Use: Yes     socially    OB History    Grav Para Term Preterm Abortions TAB SAB Ect Mult Living   5 3 3  2  2   3       Review of Systems  HENT: Positive for neck pain. Negative for neck stiffness.   Eyes: Negative for photophobia.  Respiratory: Negative for cough and shortness of  breath.   Genitourinary: Positive for frequency. Negative for bladder incontinence.  Musculoskeletal: Positive for back pain.  Neurological: Negative for tingling and numbness.    Allergies  Review of patient's allergies indicates no known allergies.  Home Medications   Current Outpatient Rx  Name Route Sig Dispense Refill  . HYDROCODONE-ACETAMINOPHEN 5-500 MG PO TABS Oral Take 1 tablet by mouth every 6 (six) hours as needed. For pain.    . IBUPROFEN 200 MG PO TABS Oral Take 4 tablets (800 mg total) by mouth every 8 (eight) hours as needed. For pain 60 tablet 1  . LISINOPRIL-HYDROCHLOROTHIAZIDE 20-25 MG PO TABS Oral Take 1 tablet by mouth daily. 90 tablet 2  . METOPROLOL SUCCINATE ER 25 MG PO TB24 Oral Take 25 mg by mouth daily.    . ACETAMINOPHEN 500 MG PO TABS Oral Take 1,000 mg by mouth every 6 (six) hours as needed. For pain.    Marland Kitchen METFORMIN HCL 500 MG PO TABS Oral Take 1 tablet (500 mg total) by mouth 2 (two) times daily with a meal. 60 tablet 0    BP 132/97  Pulse 87  Temp(Src) 98.3 F (36.8 C) (Oral)  Resp 16  SpO2 100%  LMP 04/07/2011  Physical Exam  Nursing note and vitals reviewed. Constitutional: She appears well-developed and well-nourished. No distress.  Eyes:  Conjunctivae are normal.  Neck: Normal range of motion. Neck supple. No JVD present. Muscular tenderness present. No spinous process tenderness present. No rigidity. No tracheal deviation, no erythema and normal range of motion present. No Kernig's sign noted. No mass present.    Pulmonary/Chest: Effort normal.  Abdominal: Soft. There is no tenderness.  Lymphadenopathy:    She has no cervical adenopathy.    ED Course  Procedures (including critical care time)  Labs Reviewed  GLUCOSE, CAPILLARY - Abnormal; Notable for the following:    Glucose-Capillary 125 (*)    All other components within normal limits  POCT CBG MONITORING   No results found.   1. Hyperglycemia   2. Neck pain, musculoskeletal        MDM  Neck pain, non trauma. With vague symptomatology concerned about diabetes,        Jimmie Molly, MD 04/13/11 Marry Guan, MD 04/13/11 [phone]

## 2011-04-16 ENCOUNTER — Emergency Department (HOSPITAL_COMMUNITY)
Admission: EM | Admit: 2011-04-16 | Discharge: 2011-04-16 | Disposition: A | Discharge status: Home / Self Care | Payer: Self-pay | Attending: Emergency Medicine | Admitting: Emergency Medicine

## 2011-04-16 ENCOUNTER — Encounter (HOSPITAL_COMMUNITY): Payer: Self-pay | Admitting: *Deleted

## 2011-04-16 ENCOUNTER — Emergency Department (INDEPENDENT_AMBULATORY_CARE_PROVIDER_SITE_OTHER)
Admission: EM | Admit: 2011-04-16 | Discharge: 2011-04-16 | Disposition: A | Discharge status: Home / Self Care | Payer: Self-pay | Source: Home / Self Care | Attending: Family Medicine | Admitting: Family Medicine

## 2011-04-16 DIAGNOSIS — Z79899 Other long term (current) drug therapy: Secondary | ICD-10-CM | POA: Insufficient documentation

## 2011-04-16 DIAGNOSIS — R220 Localized swelling, mass and lump, head: Secondary | ICD-10-CM | POA: Insufficient documentation

## 2011-04-16 DIAGNOSIS — R079 Chest pain, unspecified: Secondary | ICD-10-CM | POA: Insufficient documentation

## 2011-04-16 DIAGNOSIS — R221 Localized swelling, mass and lump, neck: Secondary | ICD-10-CM

## 2011-04-16 DIAGNOSIS — I10 Essential (primary) hypertension: Secondary | ICD-10-CM | POA: Insufficient documentation

## 2011-04-16 DIAGNOSIS — E119 Type 2 diabetes mellitus without complications: Secondary | ICD-10-CM

## 2011-04-16 DIAGNOSIS — R1310 Dysphagia, unspecified: Secondary | ICD-10-CM | POA: Insufficient documentation

## 2011-04-16 LAB — POCT URINALYSIS DIP (DEVICE)
Glucose, UA: NEGATIVE mg/dL
Leukocytes, UA: NEGATIVE
Nitrite: NEGATIVE
Urobilinogen, UA: 0.2 mg/dL (ref 0.0–1.0)

## 2011-04-16 LAB — GLUCOSE, CAPILLARY: Glucose-Capillary: 181 mg/dL — ABNORMAL HIGH (ref 70–99)

## 2011-04-16 LAB — POCT I-STAT, CHEM 8
BUN: 10 mg/dL (ref 6–23)
Calcium, Ion: 1.2 mmol/L (ref 1.12–1.32)
Chloride: 103 meq/L (ref 96–112)
Creatinine, Ser: 0.8 mg/dL (ref 0.50–1.10)
Glucose, Bld: 163 mg/dL — ABNORMAL HIGH (ref 70–99)
HCT: 44 % (ref 36.0–46.0)
Hemoglobin: 15 g/dL (ref 12.0–15.0)
Potassium: 3.8 meq/L (ref 3.5–5.1)
Sodium: 139 meq/L (ref 135–145)
TCO2: 26 mmol/L (ref 0–100)

## 2011-04-16 MED ORDER — FAMOTIDINE 20 MG PO TABS: 20.0000 mg | ORAL_TABLET | Freq: Two times a day (BID) | ORAL | Status: DC

## 2011-04-16 MED ORDER — DIPHENHYDRAMINE HCL 25 MG PO CAPS
25.0000 mg | ORAL_CAPSULE | Freq: Once | ORAL | Status: AC
  Administered 2011-04-16: 25 mg via ORAL
  Filled 2011-04-16: qty 1

## 2011-04-16 MED ORDER — PREDNISONE 20 MG PO TABS: 60.0000 mg | ORAL_TABLET | Freq: Every day | ORAL | Status: AC

## 2011-04-16 MED ORDER — GLIPIZIDE 5 MG PO TABS: 5.0000 mg | ORAL_TABLET | Freq: Every day | ORAL | Status: DC

## 2011-04-16 MED ORDER — PREDNISONE 20 MG PO TABS
60.0000 mg | ORAL_TABLET | Freq: Once | ORAL | Status: AC
  Administered 2011-04-16: 60 mg via ORAL
  Filled 2011-04-16: qty 3

## 2011-04-16 MED ORDER — FAMOTIDINE 20 MG PO TABS
20.0000 mg | ORAL_TABLET | Freq: Once | ORAL | Status: AC
  Administered 2011-04-16: 20 mg via ORAL
  Filled 2011-04-16: qty 1

## 2011-04-16 MED ORDER — DIPHENHYDRAMINE HCL 25 MG PO CAPS: 25.0000 mg | ORAL_CAPSULE | Freq: Four times a day (QID) | ORAL | Status: AC | PRN

## 2011-04-16 NOTE — ED Notes (Signed)
Pt. came to [facility] and asked for her Metformin to be changed to a different Rx. because she had a reaction to it. Pt. very anxious and won't sit down.  Pt. was seen in ED for reaction to Metformin and told to come back here for follow-up and medication change.  Discussed with Dr. Juanetta Gosling. Pt. does not have a PCP.  He told pt. he did not feel comfortable changing her medication unless he checks her sugar and examines her.  Pt. started crying.  Pt. reassured and taken to front desk to register. Vassie Moselle 04/16/2011

## 2011-04-16 NOTE — ED Provider Notes (Signed)
History     CSN: [id number]  Arrival date & time 04/16/11  1349   First MD Initiated Contact with Patient 04/16/11 1424      Chief Complaint  Patient presents with  . Blood Sugar Problem    (Consider location/radiation/quality/duration/timing/severity/associated sxs/prior treatment) HPI Comments: Shirley Hernandez presents for evaluation as follow-up to a visit to the Emergency Department on the evening prior. She was recently started on metformin. She presented to the ED for dysphagia. She was treated with prednisone, Benadryl, and Pepcid in the ED. She was advised to stop the metformin, and advised to follow up here for further management of her diabetes. She denies any symptoms at this time.  Patient is a 41 y.o. female presenting with diabetes problem. The history is provided by the patient.  Diabetes She presents for her follow-up diabetic visit. She has type 2 diabetes mellitus. Her disease course has been stable. Risk factors for coronary artery disease include diabetes mellitus. Current diabetic treatment includes oral agent (monotherapy). An ACE inhibitor/angiotensin II receptor blocker is being taken.    Past Medical History  Diagnosis Date  . Hypertension   . Headache   . Diabetes mellitus   . Anxiety   . Depression     Past Surgical History  Procedure Date  . Dilation and curettage of uterus   . Dilation and curettage of uterus 03/23/2011    Procedure: DILATATION AND CURETTAGE;  Surgeon: Myra C. Marice Potter, MD;  Location: [facility];  Service: Gynecology;  Laterality: N/A;    Family History  Problem Relation Age of Onset  . Diabetes Mother   . Hypertension Mother   . Stroke Mother   . Hypertension Father   . Depression Father   . Diabetes Father   . Hypertension Sister   . Diabetes Sister   . Hypertension Brother     History  Substance Use Topics  . Smoking status: Current Some Day Smoker -- 0.5 packs/day for 12 years    Types: Cigarettes  . Smokeless tobacco: Not on file    . Alcohol Use: Yes     socially    OB History    Grav Para Term Preterm Abortions TAB SAB Ect Mult Living   5 3 3  2  2   3       Review of Systems  Constitutional: Negative.   HENT: Positive for trouble swallowing.   Eyes: Negative.   Respiratory: Negative.   Cardiovascular: Negative.   Gastrointestinal: Negative.   Genitourinary: Negative.   Musculoskeletal: Negative.   Skin: Negative.   Neurological: Negative.     Allergies  Metformin and related  Home Medications   Current Outpatient Rx  Name Route Sig Dispense Refill  . ACETAMINOPHEN 500 MG PO TABS Oral Take 1,000 mg by mouth every 6 (six) hours as needed. For pain.    Marland Kitchen FAMOTIDINE 20 MG PO TABS Oral Take 1 tablet (20 mg total) by mouth 2 (two) times daily. 30 tablet 0  . GLIPIZIDE 5 MG PO TABS Oral Take 1 tablet (5 mg total) by mouth daily. 30 tablet 0  . LISINOPRIL-HYDROCHLOROTHIAZIDE 20-25 MG PO TABS Oral Take 1 tablet by mouth daily. 90 tablet 2  . METFORMIN HCL 500 MG PO TABS Oral Take 1 tablet (500 mg total) by mouth 2 (two) times daily with a meal. 60 tablet 0  . METOPROLOL SUCCINATE ER 25 MG PO TB24 Oral Take 25 mg by mouth daily.    Marland Kitchen MIRTAZAPINE 15 MG PO TABS Oral  Take 15 mg by mouth at bedtime.      BP 137/95  Pulse 90  Temp(Src) 98.1 F (36.7 C) (Oral)  Resp 18  SpO2 96%  LMP 04/07/2011  Physical Exam  Nursing note and vitals reviewed. Constitutional: She is oriented to person, place, and time. She appears well-developed and well-nourished.  HENT:  Head: Normocephalic and atraumatic.  Eyes: EOM are normal.  Neck: Normal range of motion.  Cardiovascular: Normal rate and regular rhythm.   No murmur heard. Pulmonary/Chest: Effort normal. She has wheezes. She has rales.  Abdominal: Soft. Bowel sounds are normal. There is no tenderness.  Musculoskeletal: Normal range of motion.  Neurological: She is alert and oriented to person, place, and time.  Skin: Skin is warm and dry.  Psychiatric: Her  behavior is normal.    ED Course  Procedures (including critical care time)  Labs Reviewed  GLUCOSE, CAPILLARY - Abnormal; Notable for the following:    Glucose-Capillary 181 (*)    All other components within normal limits  POCT URINALYSIS DIP (DEVICE) - Abnormal; Notable for the following:    Hgb urine dipstick SMALL (*)    All other components within normal limits  POCT I-STAT, CHEM 8 - Abnormal; Notable for the following:    Glucose, Bld 163 (*)    All other components within normal limits  LAB REPORT - SCANNED   No results found.   1. Diabetes mellitus       MDM  Labs reviewed, creatinine normal; rx given for glipizide 5 mg; advised to enroll in [place], info given        Richardo Priest, MD 05/10/11 2242

## 2011-04-16 NOTE — ED Notes (Signed)
Pt reports taking metformin for the first time tonight and now feeling like she cannot swallow.  Pt noted to be drinking water in triage.  Pt talking in complete sentences, no distress noted.

## 2011-04-16 NOTE — ED Provider Notes (Signed)
History     CSN: [id number]  Arrival date & time 04/16/11  0207   First MD Initiated Contact with Patient 04/16/11 0230      Chief Complaint  Patient presents with  . Dysphagia    (Consider location/radiation/quality/duration/timing/severity/associated sxs/prior treatment) The history is provided by the patient.   chief complaint is allergic reaction. Patient feels that she has swelling in her throat that causes some difficulty swallowing, but she is able to swallow liquids. She started metformin today. She is followed by the urgent care. She denies any difficulty breathing, any rash or any history of the same. No pain or radiation. Symptoms started about 3 hours ago and are now improving. She doesn't take any other medications and denies any other new exposures, foods, lotions or detergents.  Past Medical History  Diagnosis Date  . Hypertension   . Headache   . Diabetes mellitus     Past Surgical History  Procedure Date  . Dilation and curettage of uterus   . Dilation and curettage of uterus 03/23/2011    Procedure: DILATATION AND CURETTAGE;  Surgeon: Myra C. Marice Potter, MD;  Location: [facility];  Service: Gynecology;  Laterality: N/A;    Family History  Problem Relation Age of Onset  . Depression Mother   . Diabetes Mother   . Hypertension Mother   . Stroke Mother   . Hypertension Father     History  Substance Use Topics  . Smoking status: Current Some Day Smoker -- 0.5 packs/day  . Smokeless tobacco: Not on file  . Alcohol Use: Yes     socially    OB History    Grav Para Term Preterm Abortions TAB SAB Ect Mult Living   5 3 3  2  2   3       Review of Systems  Constitutional: Negative for fever and chills.  HENT: Negative for sore throat, drooling, mouth sores, neck pain, neck stiffness, dental problem, voice change and sinus pressure.   Eyes: Negative for pain.  Respiratory: Negative for shortness of breath.   Cardiovascular: Negative for chest pain, palpitations  and leg swelling.  Gastrointestinal: Negative for abdominal pain.  Genitourinary: Negative for dysuria.  Musculoskeletal: Negative for back pain.  Skin: Negative for rash.  Neurological: Negative for headaches.  All other systems reviewed and are negative.    Allergies  Review of patient's allergies indicates no known allergies.  Home Medications   Current Outpatient Rx  Name Route Sig Dispense Refill  . ACETAMINOPHEN 500 MG PO TABS Oral Take 1,000 mg by mouth every 6 (six) hours as needed. For pain.    Marland Kitchen HYDROCODONE-ACETAMINOPHEN 5-500 MG PO TABS Oral Take 1 tablet by mouth every 6 (six) hours as needed. For pain.    . IBUPROFEN 200 MG PO TABS Oral Take 4 tablets (800 mg total) by mouth every 8 (eight) hours as needed. For pain 60 tablet 1  . LISINOPRIL-HYDROCHLOROTHIAZIDE 20-25 MG PO TABS Oral Take 1 tablet by mouth daily. 90 tablet 2  . METFORMIN HCL 500 MG PO TABS Oral Take 1 tablet (500 mg total) by mouth 2 (two) times daily with a meal. 60 tablet 0  . METOPROLOL SUCCINATE ER 25 MG PO TB24 Oral Take 25 mg by mouth daily.      BP 163/88  Pulse 110  Temp(Src) 98.3 F (36.8 C) (Oral)  Resp 18  SpO2 98%  LMP 04/07/2011  Physical Exam  Constitutional: She is oriented to person, place, and time. She  appears well-developed and well-nourished.  HENT:  Head: Normocephalic and atraumatic.  Nose: Nose normal.  Mouth/Throat: No oropharyngeal exudate.       Oral pharynx is clear with no lingual, oral or lip swelling. Uvula midline. No stridor. No fullness in submandibular area and nontender to palpation. No cervical lymphadenopathy. No trismus.  Eyes: Conjunctivae and EOM are normal. Pupils are equal, round, and reactive to light.  Neck: Trachea normal. Neck supple. No thyromegaly present.  Cardiovascular: Normal rate, regular rhythm, S1 normal, S2 normal and normal pulses.     No systolic murmur is present   No diastolic murmur is present  Pulses:      Radial pulses are 2+ on  the right side, and 2+ on the left side.  Pulmonary/Chest: Effort normal and breath sounds normal. She has no wheezes. She has no rhonchi. She exhibits tenderness.  Abdominal: Soft. Normal appearance and bowel sounds are normal. There is no tenderness. There is no CVA tenderness and negative Murphy's sign.  Musculoskeletal:       BLE:s Calves nontender, no cords or erythema, negative Homans sign  Neurological: She is alert and oriented to person, place, and time. She has normal strength. No cranial nerve deficit or sensory deficit. GCS eye subscore is 4. GCS verbal subscore is 5. GCS motor subscore is 6.  Skin: Skin is warm and dry. No rash noted. She is not diaphoretic.  Psychiatric: Her speech is normal.       Cooperative and appropriate    ED Course  Procedures (including critical care time)  Prednisone, Benadryl and Pepcid by mouth. Patient tolerates water no acute distress. Serial evaluations with no hypoxia and no other symptoms of allergic reaction.   MDM   Possible allergic reaction in a patient who just started metformin tonight. She is followed in urgent care and was given medications as above. No significant symptoms to suggest indication for admission or further workup at this time. No family history of angioedema the patient has no clinical angioedema (she is on an ACE inhibitor). Plan prescription for prednisone, Benadryl and Pepcid with primary care physician followup at the urgent care for further evaluation. Will hold metformin for now        Sunnie Nielsen, MD 04/16/11 0300

## 2011-04-16 NOTE — ED Notes (Signed)
Pt  Seen  Last  Pm   In  Er     Was     Seen   For  Possible  Allergic  Reaction  Was  Given  Prednisone  -   subsequiently  She  Became  Anxious  And   Felt  That  She    Had  A  Sensation  Of  Tightness in throat  At this  Time  She  Reports  That  She  Feels  Better        -  She  Is  Speaking in  Complete  sentancves   And      Is  In no  resp  Distress no  Angioedema    Noted

## 2011-04-16 NOTE — ED Notes (Signed)
PT ambulated with a steady gait; VSS; A&Ox3; no signs of distress; skin warm and dry; respirations even and unlabored; no questions at this time.

## 2011-04-22 ENCOUNTER — Ambulatory Visit (INDEPENDENT_AMBULATORY_CARE_PROVIDER_SITE_OTHER): Payer: Self-pay | Admitting: Obstetrics & Gynecology

## 2011-04-22 ENCOUNTER — Encounter: Payer: Self-pay | Admitting: Obstetrics and Gynecology

## 2011-04-22 VITALS — BP 130/89 | HR 98 | Temp 98.8°F | Resp 20 | Ht 65.0 in | Wt 202.1 lb

## 2011-04-22 DIAGNOSIS — Z3049 Encounter for surveillance of other contraceptives: Secondary | ICD-10-CM

## 2011-04-22 DIAGNOSIS — Z9889 Other specified postprocedural states: Secondary | ICD-10-CM

## 2011-04-22 DIAGNOSIS — Z23 Encounter for immunization: Secondary | ICD-10-CM

## 2011-04-22 MED ORDER — MEDROXYPROGESTERONE ACETATE 150 MG/ML IM SUSP
150.0000 mg | Freq: Once | INTRAMUSCULAR | Status: AC
  Administered 2011-04-22: 150 mg via INTRAMUSCULAR

## 2011-04-22 MED ORDER — INFLUENZA VIRUS VACC SPLIT PF IM SUSP
0.5000 mL | Freq: Once | INTRAMUSCULAR | Status: AC
  Administered 2011-04-22: 0.5 mL via INTRAMUSCULAR

## 2011-04-22 NOTE — Progress Notes (Signed)
  Subjective:    Patient ID: Shirley Hernandez, female    DOB: 07-25-1970, 41 y.o.   MRN: [id number]  HPI  She is here for a post op check after having a d&c for hematometrium after cervical stenosis. Her pain has resolved with surgery. She has had sex once since surgery and used condoms. She wants to restart depo provera "Today!". She has used it for 17years.  She was recently diagnosed with AODM and has lost 10 pounds by quitting sodas. I have strongy suggested another form of birth control like Mirena IUD since depo increases appetite and weight gain.  Review of Systems     Objective:   Physical Exam        Assessment & Plan:  Post op doing well Depo provera today and schedule Mirena insertion.

## 2011-05-26 ENCOUNTER — Encounter (HOSPITAL_COMMUNITY): Payer: Self-pay

## 2011-05-26 ENCOUNTER — Emergency Department (INDEPENDENT_AMBULATORY_CARE_PROVIDER_SITE_OTHER)
Admission: EM | Admit: 2011-05-26 | Discharge: 2011-05-26 | Disposition: A | Discharge status: Home / Self Care | Payer: Self-pay | Source: Home / Self Care | Attending: Family Medicine | Admitting: Family Medicine

## 2011-05-26 DIAGNOSIS — E119 Type 2 diabetes mellitus without complications: Secondary | ICD-10-CM

## 2011-05-26 MED ORDER — GLIPIZIDE 5 MG PO TABS: 5.0000 mg | ORAL_TABLET | Freq: Two times a day (BID) | ORAL | Status: DC

## 2011-05-26 NOTE — ED Notes (Signed)
Pt was discharged in ambulatory in stable condition.  Given Rx for glipizide and instructed to follow up with PCP. Discharged by Jannette Spanner RN - did not get pt to sign out.  Pt voiced understanding of instructions per R. Markle

## 2011-05-26 NOTE — ED Notes (Signed)
Pt states she needs refill on Glipizide- has been out since Friday.

## 2011-05-26 NOTE — Discharge Instructions (Signed)
Further refills should come from your doctor

## 2011-05-26 NOTE — ED Provider Notes (Signed)
History     CSN: [id number]  Arrival date & time 05/26/11  1142   First MD Initiated Contact with Patient 05/26/11 1148      Chief Complaint  Patient presents with  . Medication Refill    (Consider location/radiation/quality/duration/timing/severity/associated sxs/prior treatment) Patient is a 41 y.o. female presenting with diabetes problem.  Diabetes She presents for her follow-up diabetic visit. She has type 2 diabetes mellitus. Her disease course has been stable. There are no diabetic associated symptoms. (Requesting med refill) Symptoms are stable.    Past Medical History  Diagnosis Date  . Hypertension   . Headache   . Diabetes mellitus   . Anxiety   . Depression     Past Surgical History  Procedure Date  . Dilation and curettage of uterus   . Dilation and curettage of uterus 03/23/2011    Procedure: DILATATION AND CURETTAGE;  Surgeon: Myra C. Marice Potter, MD;  Location: [facility];  Service: Gynecology;  Laterality: N/A;    Family History  Problem Relation Age of Onset  . Diabetes Mother   . Hypertension Mother   . Stroke Mother   . Hypertension Father   . Depression Father   . Diabetes Father   . Hypertension Sister   . Diabetes Sister   . Hypertension Brother     History  Substance Use Topics  . Smoking status: Current Some Day Smoker -- 0.5 packs/day for 12 years    Types: Cigarettes  . Smokeless tobacco: Not on file  . Alcohol Use: Yes     socially    OB History    Grav Para Term Preterm Abortions TAB SAB Ect Mult Living   5 3 3  2  2   3       Review of Systems  Constitutional: Negative.     Allergies  Metformin and related  Home Medications   Current Outpatient Rx  Name Route Sig Dispense Refill  . FAMOTIDINE 20 MG PO TABS Oral Take 1 tablet (20 mg total) by mouth 2 (two) times daily. 30 tablet 0  . GLIPIZIDE 5 MG PO TABS Oral Take 1 tablet (5 mg total) by mouth daily. 30 tablet 0  . LISINOPRIL-HYDROCHLOROTHIAZIDE 20-25 MG PO TABS Oral Take  1 tablet by mouth daily. 90 tablet 2  . METOPROLOL SUCCINATE ER 25 MG PO TB24 Oral Take 25 mg by mouth daily.    . ACETAMINOPHEN 500 MG PO TABS Oral Take 1,000 mg by mouth every 6 (six) hours as needed. For pain.    Marland Kitchen GLIPIZIDE 5 MG PO TABS Oral Take 1 tablet (5 mg total) by mouth 2 (two) times daily before a meal. 30 tablet 1  . METFORMIN HCL 500 MG PO TABS Oral Take 1 tablet (500 mg total) by mouth 2 (two) times daily with a meal. 60 tablet 0  . MIRTAZAPINE 15 MG PO TABS Oral Take 15 mg by mouth at bedtime.      BP 149/81  Pulse 96  Temp(Src) 98.7 F (37.1 C) (Oral)  Resp 20  SpO2 96%  Physical Exam  Nursing note and vitals reviewed. Constitutional: She is oriented to person, place, and time. She appears well-developed and well-nourished.  HENT:  Head: Normocephalic.  Neurological: She is alert and oriented to person, place, and time.  Skin: Skin is warm and dry.    ED Course  Procedures (including critical care time)  Labs Reviewed - No data to display No results found.   1. Diabetes mellitus  MDM  Home bs doing v well according to pt.        Barkley Bruns, MD 05/26/11 1359

## 2011-07-08 ENCOUNTER — Ambulatory Visit (INDEPENDENT_AMBULATORY_CARE_PROVIDER_SITE_OTHER): Payer: Self-pay | Admitting: Family Medicine

## 2011-07-08 ENCOUNTER — Encounter: Payer: Self-pay | Admitting: Family Medicine

## 2011-07-08 VITALS — BP 137/97 | HR 92 | Ht 65.0 in | Wt 208.5 lb

## 2011-07-08 DIAGNOSIS — N926 Irregular menstruation, unspecified: Secondary | ICD-10-CM

## 2011-07-08 DIAGNOSIS — N938 Other specified abnormal uterine and vaginal bleeding: Secondary | ICD-10-CM

## 2011-07-08 DIAGNOSIS — N920 Excessive and frequent menstruation with regular cycle: Secondary | ICD-10-CM

## 2011-07-08 MED ORDER — MEDROXYPROGESTERONE ACETATE 150 MG/ML IM SUSP
150.0000 mg | Freq: Once | INTRAMUSCULAR | Status: AC
  Administered 2011-07-08: 150 mg via INTRAMUSCULAR

## 2011-07-08 NOTE — Progress Notes (Signed)
Addended by: Lynnell Dike on: 07/08/2011 03:20 PM   Modules accepted: Orders

## 2011-07-08 NOTE — Progress Notes (Signed)
  Subjective:    Patient ID: Shirley Hernandez, female    DOB: 10/04/1970, 41 y.o.   MRN: [id number]  HPI  Patient seen for mild intermittent spotting over past few weeks.  Had depo shot approx 3 months ago.  Had applied for [employer], which was denied.  Review of Systems     Objective:   Physical Exam  Constitutional: She is oriented to person, place, and time. She appears well-developed and well-nourished.  Neurological: She is alert and oriented to person, place, and time.  Skin: Skin is warm and dry. No rash noted. No erythema. No pallor.  Psychiatric: She has a normal mood and affect. Her behavior is normal. Judgment and thought content normal.      Assessment & Plan:  1.  Contraception Patient in process of getting orange card.  Will make appt to get Mirena for menorrhagia when gets orange card.  Will give depo provera today.

## 2011-07-21 ENCOUNTER — Encounter: Payer: Self-pay | Admitting: Family Medicine

## 2011-07-21 ENCOUNTER — Ambulatory Visit (INDEPENDENT_AMBULATORY_CARE_PROVIDER_SITE_OTHER): Payer: Self-pay | Admitting: Family Medicine

## 2011-07-21 DIAGNOSIS — E119 Type 2 diabetes mellitus without complications: Secondary | ICD-10-CM | POA: Insufficient documentation

## 2011-07-21 DIAGNOSIS — I10 Essential (primary) hypertension: Secondary | ICD-10-CM

## 2011-07-21 DIAGNOSIS — E669 Obesity, unspecified: Secondary | ICD-10-CM

## 2011-07-21 MED ORDER — LISINOPRIL-HYDROCHLOROTHIAZIDE 20-25 MG PO TABS: 1.0000 | ORAL_TABLET | Freq: Every day | ORAL | Status: DC

## 2011-07-21 MED ORDER — METOPROLOL SUCCINATE ER 25 MG PO TB24: 25.0000 mg | ORAL_TABLET | Freq: Every day | ORAL | Status: DC

## 2011-07-21 MED ORDER — GLIPIZIDE 5 MG PO TABS: 5.0000 mg | ORAL_TABLET | Freq: Two times a day (BID) | ORAL | Status: DC

## 2011-07-21 NOTE — Assessment & Plan Note (Signed)
Uncontrolled.  Will restart BP medications, return for fasting bloodwork in 1 week.  Will follow-up with me in 2 weeks.

## 2011-07-21 NOTE — Assessment & Plan Note (Signed)
Will refer to DM edu for basic education, will continue to follow-up

## 2011-07-21 NOTE — Assessment & Plan Note (Signed)
New diagnosis at ER.  Will continue glipizide will check a1c, refer to DM education.

## 2011-07-21 NOTE — Patient Instructions (Addendum)
Restart blood pressure medicines  Keep taking glipizide.  Make lab appointment for fasting bloodwork in 1 week  Will refer you to diabetes education!!  Come back for appointment in 2-3 weeks to see me to check up on your blood pressure one time before Dr. Gwendolyn Grant gets back

## 2011-07-21 NOTE — Progress Notes (Signed)
  Subjective:    Patient ID: Shirley Hernandez, female    DOB: [birth year], 41 y.o.   MRN: [id number]  HPI Here to follow-up for Dr. Gwendolyn Grant.  Was uninsured and did not follow-up.  Since last seen, was diagnosed with diabetes.  DIABETES  Taking and tolerating: yes- metformin "closed her throat"- taking glipizide once daily. Fasting blood sugars: fasting 130's Hypoglycemic symptoms: yes- was 50 the other day. Visual problems: no Monitoring feet: yes Numbness/Tingling: no Last eye exam: years ago Diabetic Labs:  No results found for this basename: HGBA1C   Lab Results  Component Value Date   CREATININE 0.80 04/16/2011   Last microalbumin: No results found for this basename: MICROALBUR, [id number]     HYPERTENSION Currently out of all medicines.   BP Readings from Last 3 Encounters:  07/21/11 144/105  07/08/11 137/97  05/26/11 149/81    Hypertension ROS: taking medications as instructed, not taking medications regularly as instructed, no medication side effects noted, no chest pain on exertion, no dyspnea on exertion and no swelling of ankles. Does not check home BP's  I have reviewed patient's  PMH, FH, and Social history and Medications as related to this visit.  Review of Systemsneg except as per HPI     Objective:   Physical Exam GEN: Alert & Oriented, No acute distress CV:  Regular Rate & Rhythm, no murmur Respiratory:  Normal work of breathing, CTAB Abd:  + BS, soft, no tenderness to palpation Ext: no pre-tibial edema        Assessment & Plan:

## 2011-07-28 ENCOUNTER — Other Ambulatory Visit (INDEPENDENT_AMBULATORY_CARE_PROVIDER_SITE_OTHER): Payer: Self-pay

## 2011-07-28 DIAGNOSIS — I10 Essential (primary) hypertension: Secondary | ICD-10-CM

## 2011-07-28 LAB — COMPREHENSIVE METABOLIC PANEL
AST: 20 U/L (ref 0–37)
Albumin: 4.2 g/dL (ref 3.5–5.2)
Alkaline Phosphatase: 54 U/L (ref 39–117)
Potassium: 4 mEq/L (ref 3.5–5.3)
Sodium: 140 mEq/L (ref 135–145)
Total Protein: 6.9 g/dL (ref 6.0–8.3)

## 2011-07-28 LAB — LIPID PANEL
HDL: 32 mg/dL — ABNORMAL LOW (ref >39)
LDL Cholesterol: 133 mg/dL — ABNORMAL HIGH (ref 0–99)
VLDL: 25 mg/dL (ref 0–40)

## 2011-07-28 LAB — POCT GLYCOSYLATED HEMOGLOBIN (HGB A1C): Hemoglobin A1C: 6

## 2011-07-28 NOTE — Progress Notes (Signed)
FLP,CMP AND A1C DONE TODAY Shirley Hernandez 

## 2011-07-29 ENCOUNTER — Encounter: Payer: Self-pay | Admitting: Family Medicine

## 2011-07-29 DIAGNOSIS — E785 Hyperlipidemia, unspecified: Secondary | ICD-10-CM | POA: Insufficient documentation

## 2011-08-04 ENCOUNTER — Encounter: Payer: Self-pay | Admitting: Family Medicine

## 2011-08-04 ENCOUNTER — Ambulatory Visit (INDEPENDENT_AMBULATORY_CARE_PROVIDER_SITE_OTHER): Payer: Self-pay | Admitting: Family Medicine

## 2011-08-04 VITALS — BP 129/96 | HR 89 | Temp 98.5°F | Ht 65.0 in | Wt 212.0 lb

## 2011-08-04 DIAGNOSIS — E785 Hyperlipidemia, unspecified: Secondary | ICD-10-CM

## 2011-08-04 DIAGNOSIS — I10 Essential (primary) hypertension: Secondary | ICD-10-CM

## 2011-08-04 DIAGNOSIS — E119 Type 2 diabetes mellitus without complications: Secondary | ICD-10-CM

## 2011-08-04 MED ORDER — PRAVASTATIN SODIUM 40 MG PO TABS: 40.0000 mg | ORAL_TABLET | Freq: Every day | ORAL | Status: DC

## 2011-08-04 NOTE — Patient Instructions (Addendum)
Blood pressure goal: Less than 130/80.  Write down your blood pressures a1c goal: 3 month average of sugar you want less than 7.  Yours is 6% Goal fasting blood sugar- 80-110 New medicine for cholesterol: pravastatin  Follow-up with Dr. Gwendolyn Grant in 3 months or sooner if blood pressure stays high

## 2011-08-04 NOTE — Assessment & Plan Note (Addendum)
At goal.  Changed glipizde to once daily, advised to keep log, reviewed a1c goals.  Has DM edu scheduled in 1 month

## 2011-08-04 NOTE — Progress Notes (Signed)
  Subjective:    Patient ID: Shirley Hernandez, female    DOB: November 06, 1970, 41 y.o.   MRN: [id number]  HPIHere for follow-up  HYPERTENSION  BP Readings from Last 3 Encounters:  08/04/11 129/96  07/21/11 144/105  07/08/11 137/97    Hypertension ROS: taking medications as instructed, no medication side effects noted, patient does not perform home BP monitoring, no chest pain on exertion, no dyspnea on exertion and no swelling of ankles.   Did not take medicines this morning   DIABETES  Taking and tolerating: yes-glipizide once a day instead of twice a day Fasting blood sugars:90's Hypoglycemic symptoms: yes- occ has one in 50's  Diabetic Labs:  Lab Results  Component Value Date   HGBA1C 6.0 07/28/2011   Lab Results  Component Value Date   LDLCALC 133* 07/28/2011   CREATININE 0.92 07/28/2011   Last microalbumin: No results found for this basename: MICROALBUR, [id number]    HYPERLIPIDEMIA  Diet: Not following low cholesterol diet Exercise: Has started walking Wt Readings from Last 3 Encounters:  08/04/11 212 lb (96.163 kg)  07/21/11 215 lb 9.6 oz (97.796 kg)  07/08/11 208 lb 8 oz (94.575 kg)   ROS:  Denies RUQ pain, myalgias, or symptoms or coronary ischemia Lab Results  Component Value Date   LDLCALC 133* 07/28/2011   Lab Results  Component Value Date   CHOL 190 07/28/2011   Lab Results  Component Value Date   HDL 32* 07/28/2011   Lab Results  Component Value Date   TRIG 125 07/28/2011   Lab Results  Component Value Date   ALT 21 07/28/2011   AST 20 07/28/2011   ALKPHOS 54 07/28/2011   BILITOT 0.3 07/28/2011         Review of Systems     Objective:   Physical Exam        Assessment & Plan:

## 2011-08-04 NOTE — Assessment & Plan Note (Signed)
Improved, but diastolic still not at goal.  Has not taken BO meds this morning.  I suspect she may benenfit from an additional agent.  Since adding pravastatin today, will defer adding additional med.  Patient will start checking her home BP's and would like to check and see what medicine her sister takes as she said it has worked well for her in the past.  Will bring log and follow-up with Dr. Gwendolyn Grant in 1 month

## 2011-08-04 NOTE — Assessment & Plan Note (Signed)
Will start pravastatin today, follow-up with PCP

## 2011-08-31 ENCOUNTER — Ambulatory Visit: Payer: Self-pay | Admitting: *Deleted

## 2011-09-02 ENCOUNTER — Other Ambulatory Visit: Payer: Self-pay | Admitting: Family Medicine

## 2011-09-17 ENCOUNTER — Ambulatory Visit: Payer: Self-pay | Admitting: Family Medicine

## 2011-10-02 ENCOUNTER — Ambulatory Visit (INDEPENDENT_AMBULATORY_CARE_PROVIDER_SITE_OTHER): Payer: Self-pay | Admitting: *Deleted

## 2011-10-02 VITALS — BP 124/90 | HR 108 | Temp 98.6°F | Ht 65.0 in | Wt 210.8 lb

## 2011-10-02 DIAGNOSIS — N949 Unspecified condition associated with female genital organs and menstrual cycle: Secondary | ICD-10-CM

## 2011-10-02 DIAGNOSIS — N938 Other specified abnormal uterine and vaginal bleeding: Secondary | ICD-10-CM

## 2011-10-02 MED ORDER — MEDROXYPROGESTERONE ACETATE 150 MG/ML IM SUSP
150.0000 mg | Freq: Once | INTRAMUSCULAR | Status: AC
  Administered 2011-10-02: 150 mg via INTRAMUSCULAR

## 2011-10-02 NOTE — Progress Notes (Signed)
Patient states that she is starting to have pain similar to before she had the d&c. I advised that she make an appointment to be revaluated. Patient agrees with plan.

## 2011-11-11 ENCOUNTER — Telehealth: Payer: Self-pay | Admitting: Family Medicine

## 2011-11-11 NOTE — Telephone Encounter (Signed)
Patient needs a note stating that it is ok for her to do aerobic exercise.  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 It looks like she was seen by [facility] in May.  Please call her when it is ready to be picked up.

## 2011-11-11 NOTE — Telephone Encounter (Signed)
Will forward to Dr. Briscoe. 

## 2011-11-16 ENCOUNTER — Telehealth: Payer: Self-pay | Admitting: *Deleted

## 2011-11-16 NOTE — Telephone Encounter (Signed)
Called and informed patient that the letter she requested is at front desk ready for her to pick up at her convenience.Busick, Rodena Medin

## 2011-11-27 ENCOUNTER — Ambulatory Visit: Payer: Self-pay | Admitting: Family Medicine

## 2011-12-11 ENCOUNTER — Other Ambulatory Visit: Payer: Self-pay | Admitting: Family Medicine

## 2011-12-11 ENCOUNTER — Other Ambulatory Visit (HOSPITAL_COMMUNITY): Payer: Self-pay | Admitting: Radiation Oncology

## 2011-12-18 ENCOUNTER — Ambulatory Visit: Payer: Self-pay

## 2011-12-21 ENCOUNTER — Ambulatory Visit: Payer: Self-pay

## 2011-12-23 ENCOUNTER — Ambulatory Visit (INDEPENDENT_AMBULATORY_CARE_PROVIDER_SITE_OTHER): Payer: Self-pay | Admitting: General Practice

## 2011-12-23 VITALS — BP 137/98 | HR 84 | Temp 98.0°F | Ht 66.0 in | Wt 213.6 lb

## 2011-12-23 DIAGNOSIS — N949 Unspecified condition associated with female genital organs and menstrual cycle: Secondary | ICD-10-CM

## 2011-12-23 DIAGNOSIS — N938 Other specified abnormal uterine and vaginal bleeding: Secondary | ICD-10-CM

## 2011-12-23 DIAGNOSIS — N920 Excessive and frequent menstruation with regular cycle: Secondary | ICD-10-CM

## 2011-12-23 MED ORDER — MEDROXYPROGESTERONE ACETATE 150 MG/ML IM SUSP
150.0000 mg | Freq: Once | INTRAMUSCULAR | Status: AC
  Administered 2011-12-23: 150 mg via INTRAMUSCULAR

## 2012-03-09 ENCOUNTER — Ambulatory Visit (INDEPENDENT_AMBULATORY_CARE_PROVIDER_SITE_OTHER): Payer: Self-pay | Admitting: General Practice

## 2012-03-09 ENCOUNTER — Ambulatory Visit: Payer: Self-pay

## 2012-03-09 VITALS — BP 129/89 | HR 89 | Temp 96.8°F | Ht 66.0 in | Wt 210.5 lb

## 2012-03-09 DIAGNOSIS — N920 Excessive and frequent menstruation with regular cycle: Secondary | ICD-10-CM

## 2012-03-09 MED ORDER — MEDROXYPROGESTERONE ACETATE 150 MG/ML IM SUSP
150.0000 mg | Freq: Once | INTRAMUSCULAR | Status: AC
  Administered 2012-03-09: 150 mg via INTRAMUSCULAR

## 2012-03-10 ENCOUNTER — Ambulatory Visit: Payer: Self-pay | Admitting: Family

## 2012-03-24 ENCOUNTER — Ambulatory Visit: Payer: Self-pay | Admitting: Family

## 2012-04-26 ENCOUNTER — Other Ambulatory Visit: Payer: Self-pay | Admitting: *Deleted

## 2012-04-27 MED ORDER — GLIPIZIDE 5 MG PO TABS: 5.0000 mg | ORAL_TABLET | Freq: Every day | ORAL | Status: DC

## 2012-05-25 ENCOUNTER — Ambulatory Visit (INDEPENDENT_AMBULATORY_CARE_PROVIDER_SITE_OTHER): Payer: Self-pay | Admitting: Obstetrics and Gynecology

## 2012-05-25 VITALS — BP 138/95 | HR 97 | Temp 98.4°F | Wt 209.0 lb

## 2012-05-25 DIAGNOSIS — N938 Other specified abnormal uterine and vaginal bleeding: Secondary | ICD-10-CM

## 2012-05-25 MED ORDER — MEDROXYPROGESTERONE ACETATE 150 MG/ML IM SUSP
150.0000 mg | Freq: Once | INTRAMUSCULAR | Status: AC
  Administered 2012-05-25: 150 mg via INTRAMUSCULAR

## 2012-07-25 ENCOUNTER — Other Ambulatory Visit: Payer: Self-pay | Admitting: Family Medicine

## 2012-08-10 ENCOUNTER — Ambulatory Visit: Payer: Self-pay

## 2012-08-12 ENCOUNTER — Ambulatory Visit (INDEPENDENT_AMBULATORY_CARE_PROVIDER_SITE_OTHER): Payer: Medicaid Other

## 2012-08-12 VITALS — BP 138/99 | HR 86 | Wt 209.7 lb

## 2012-08-12 DIAGNOSIS — N938 Other specified abnormal uterine and vaginal bleeding: Secondary | ICD-10-CM

## 2012-08-12 DIAGNOSIS — N949 Unspecified condition associated with female genital organs and menstrual cycle: Secondary | ICD-10-CM

## 2012-08-12 MED ORDER — MEDROXYPROGESTERONE ACETATE 150 MG/ML IM SUSP
150.0000 mg | Freq: Once | INTRAMUSCULAR | Status: AC
  Administered 2012-08-12: 150 mg via INTRAMUSCULAR

## 2012-08-22 ENCOUNTER — Ambulatory Visit (INDEPENDENT_AMBULATORY_CARE_PROVIDER_SITE_OTHER): Payer: Medicaid Other | Admitting: Family Medicine

## 2012-08-22 ENCOUNTER — Encounter: Payer: Self-pay | Admitting: Family Medicine

## 2012-08-22 VITALS — BP 132/93 | HR 99 | Ht 66.0 in | Wt 207.0 lb

## 2012-08-22 DIAGNOSIS — I10 Essential (primary) hypertension: Secondary | ICD-10-CM

## 2012-08-22 DIAGNOSIS — E119 Type 2 diabetes mellitus without complications: Secondary | ICD-10-CM

## 2012-08-22 LAB — COMPLETE METABOLIC PANEL WITH GFR
AST: 20 U/L (ref 0–37)
Alkaline Phosphatase: 66 U/L (ref 39–117)
BUN: 14 mg/dL (ref 6–23)
Calcium: 9.4 mg/dL (ref 8.4–10.5)
GFR, Est African American: >89 mL/min
GFR, Est Non African American: 81 mL/min
Potassium: 3.8 mEq/L (ref 3.5–5.3)
Sodium: 138 mEq/L (ref 135–145)
Total Bilirubin: 0.4 mg/dL (ref 0.3–1.2)

## 2012-08-22 MED ORDER — LISINOPRIL-HYDROCHLOROTHIAZIDE 20-25 MG PO TABS: ORAL_TABLET | ORAL | Status: DC

## 2012-08-22 MED ORDER — METOPROLOL SUCCINATE ER 50 MG PO TB24: 50.0000 mg | ORAL_TABLET | Freq: Every day | ORAL | Status: DC

## 2012-08-22 MED ORDER — TRIAMCINOLONE ACETONIDE 0.025 % EX OINT: TOPICAL_OINTMENT | CUTANEOUS | Status: DC

## 2012-08-22 MED ORDER — GLIPIZIDE 5 MG PO TABS: 5.0000 mg | ORAL_TABLET | Freq: Two times a day (BID) | ORAL | Status: DC

## 2012-08-22 NOTE — Assessment & Plan Note (Signed)
BP not at goal. Will increase Toprol XL to 50 mg daily. Patient to return in 1 month for reassessment.

## 2012-08-22 NOTE — Addendum Note (Signed)
Addended by: Jennette Bill on: 08/22/2012 02:50 PM   Modules accepted: Orders

## 2012-08-22 NOTE — Patient Instructions (Addendum)
It was nice seeing you today.    I have increase your Metoprolol to 50 mg daily.    I have also refilled your Triamcinolone.   Please follow up with me in 1 month to reassess your BP.  I will mail you a letter with your lab results.

## 2012-08-22 NOTE — Progress Notes (Signed)
Subjective:     Patient ID: Shirley Hernandez, female   DOB: 1970-07-25, 42 y.o.   MRN: [id number]  HPI 42 year old female presents for follow up regarding HTN and DM.  1) HTN Disease Monitoring: Home BP Monitoring - Yes Chest pain - No    Dyspnea- No Medications: Lisinopril/HCTZ, Metoprolol Compliance-  Yes Lightheadedness- No Edema- No ROS - See HPI   2) DM-2 Disease Monitoring  Blood Sugar Ranges: 140 - 190's  Polyuria: no   Visual problems: Yes  Medication Compliance: yes  Medication Side Effects  Hypoglycemia: Yes. Patient reports that it occurs ~twice a week. It occurs around lunch time, as she often skips lunch.    Review of Systems Per HPI    Objective:   Physical Exam Filed Vitals:   08/22/12 1332  BP: 132/93  Pulse: 99  Exam: General: well appearing, NAD Cardiovascular: Tachycardic.  Regular rhythm. No murmurs, rubs, or gallops. Respiratory: CTAB. Abdomen: soft, nontender, nondistended. Extremities: No LE edema.     Assessment:     See Problem list    Plan:

## 2012-08-22 NOTE — Assessment & Plan Note (Signed)
A1C 6.4 today. Patient doing well but has hypoglycemia when skips meals.  Educated patient on importance of regular meal schedule. Will continue current Glipizide regimen (5mg  BIDAC)

## 2012-08-29 ENCOUNTER — Encounter: Payer: Self-pay | Admitting: Family Medicine

## 2012-10-28 ENCOUNTER — Ambulatory Visit (INDEPENDENT_AMBULATORY_CARE_PROVIDER_SITE_OTHER): Payer: Medicaid Other | Admitting: General Practice

## 2012-10-28 VITALS — BP 137/98 | HR 93 | Temp 98.0°F | Ht 66.0 in | Wt 212.4 lb

## 2012-10-28 DIAGNOSIS — N949 Unspecified condition associated with female genital organs and menstrual cycle: Secondary | ICD-10-CM

## 2012-10-28 DIAGNOSIS — N938 Other specified abnormal uterine and vaginal bleeding: Secondary | ICD-10-CM

## 2012-10-28 MED ORDER — MEDROXYPROGESTERONE ACETATE 150 MG/ML IM SUSP
150.0000 mg | Freq: Once | INTRAMUSCULAR | Status: AC
  Administered 2012-10-28: 150 mg via INTRAMUSCULAR

## 2012-11-01 ENCOUNTER — Ambulatory Visit: Payer: Medicaid Other | Admitting: Family Medicine

## 2012-12-28 IMAGING — US US PELVIS COMPLETE
1 series · 13 of 25 positions shown · non-contrast
Comparison: 03/25/2009 and 01/02/2009.

CLINICAL DATA: Vaginal bleeding and cramping.  Cervical stenosis on
exam.  History of uterine fibroids and dysfunctional uterine
bleeding.

TRANSABDOMINAL AND TRANSVAGINAL ULTRASOUND OF PELVIS
TECHNIQUE: Both transabdominal and transvaginal ultrasound
examinations of the pelvis were performed. Transabdominal technique
was performed for global imaging of the pelvis including uterus,
ovaries, adnexal regions, and pelvic cul-de-sac.

[Series 1: us pelvis complete · 54 acquisitions, 13 frames shown]
[im 1/54]
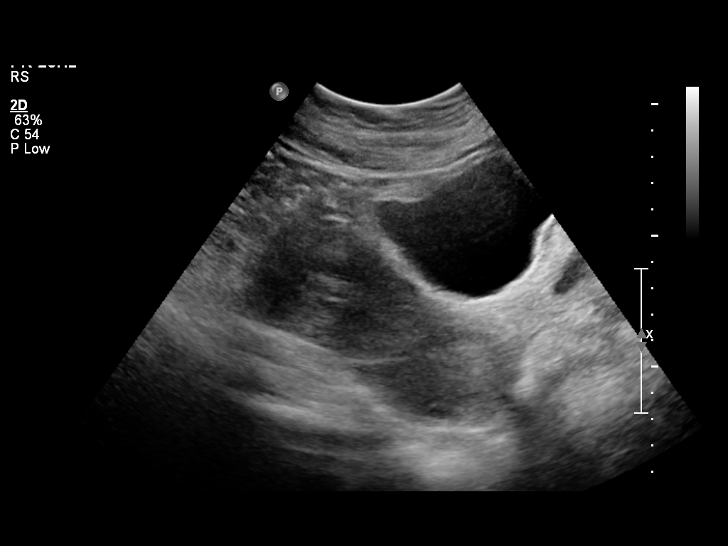
[im 5/54]
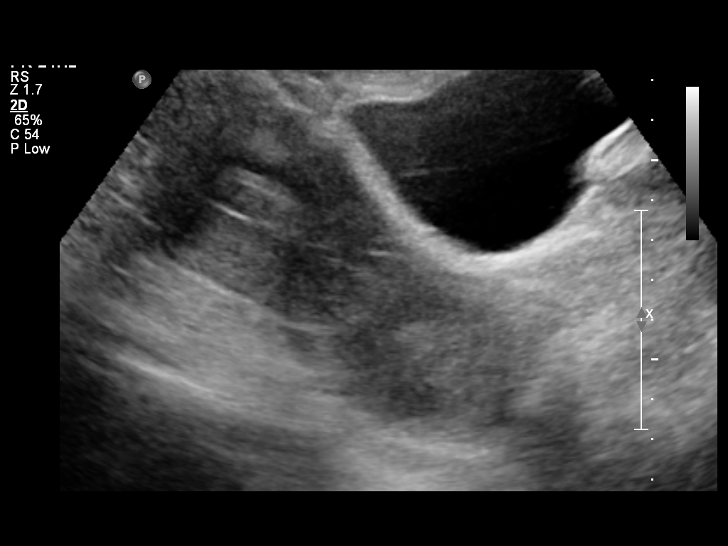
[im 9/54]
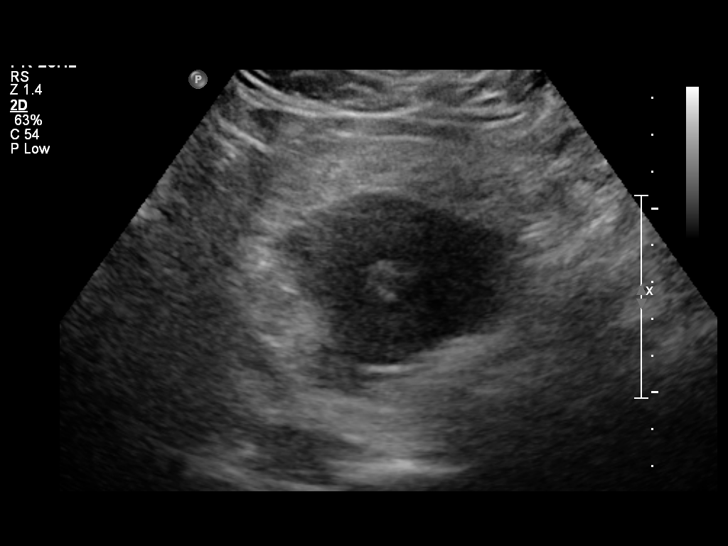
[im 14/54]
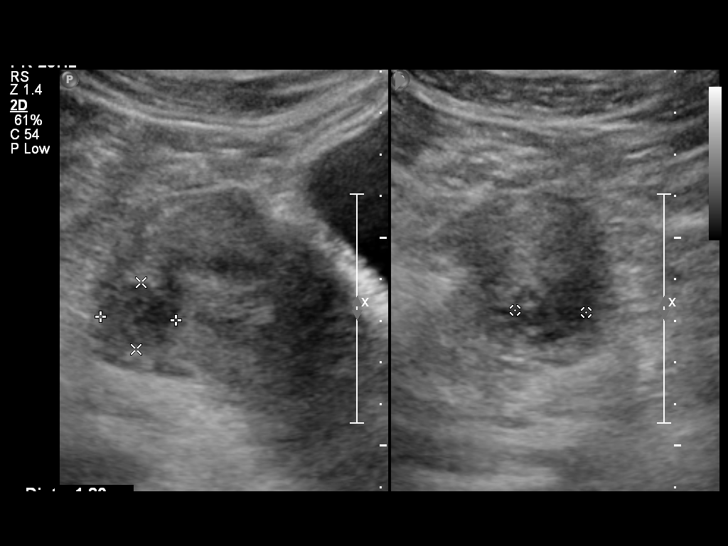
[im 18/54]
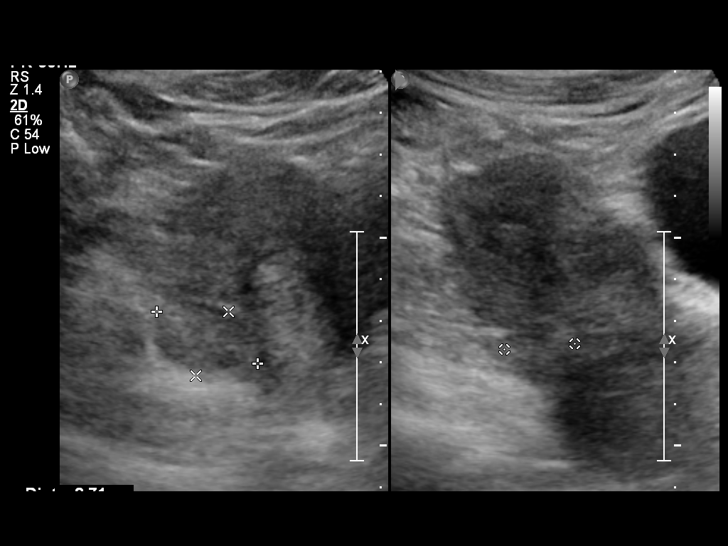
[im 23/54]
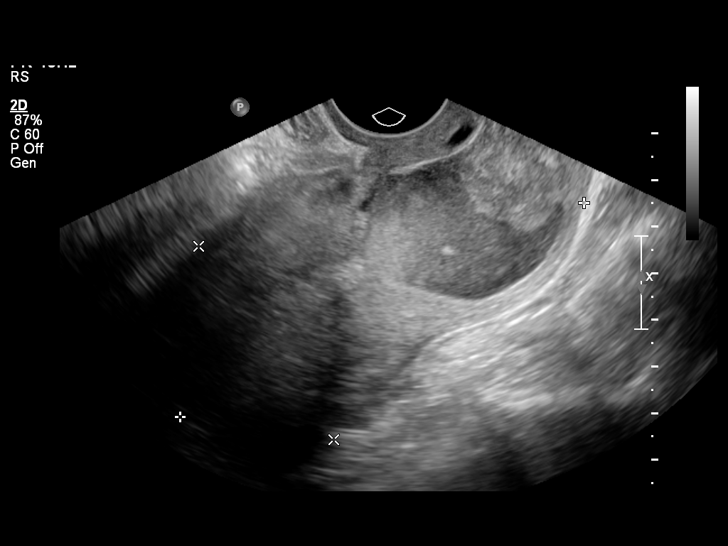
[im 27/54]
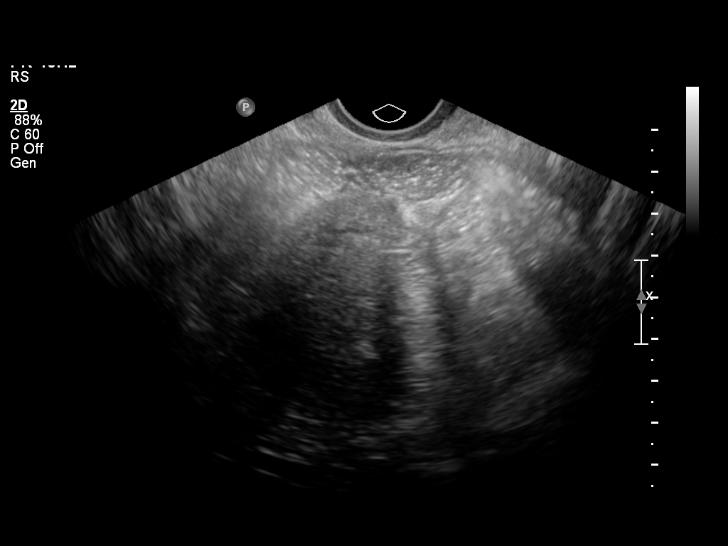
[im 31/54]
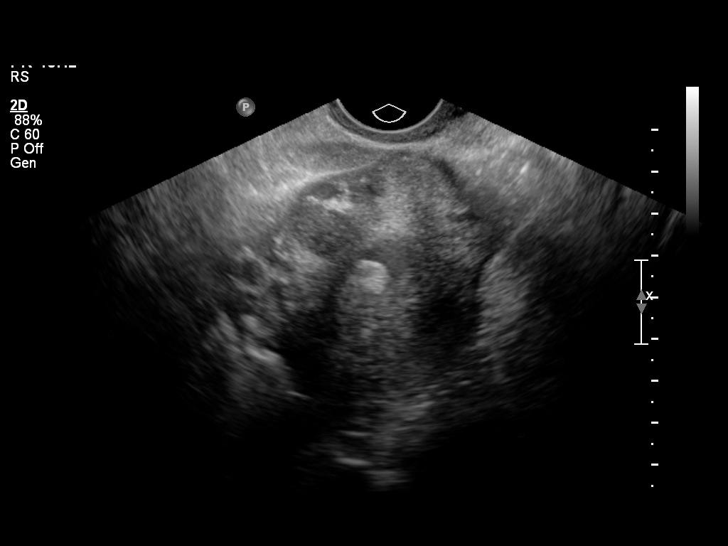
[im 36/54]
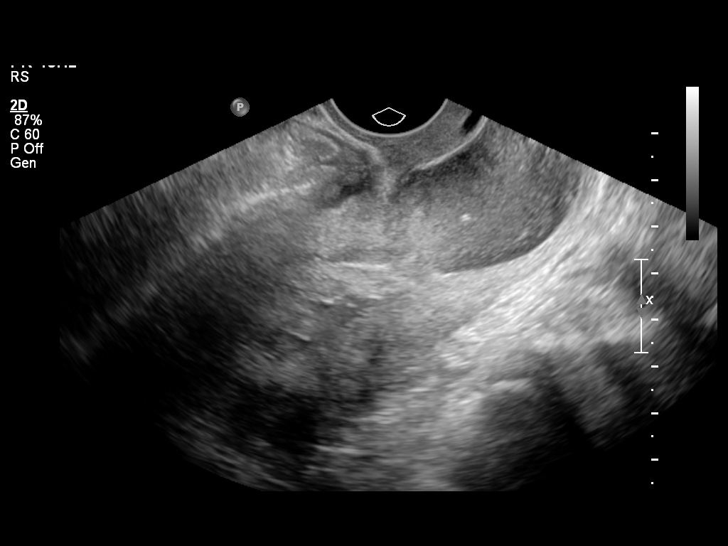
[im 40/54]
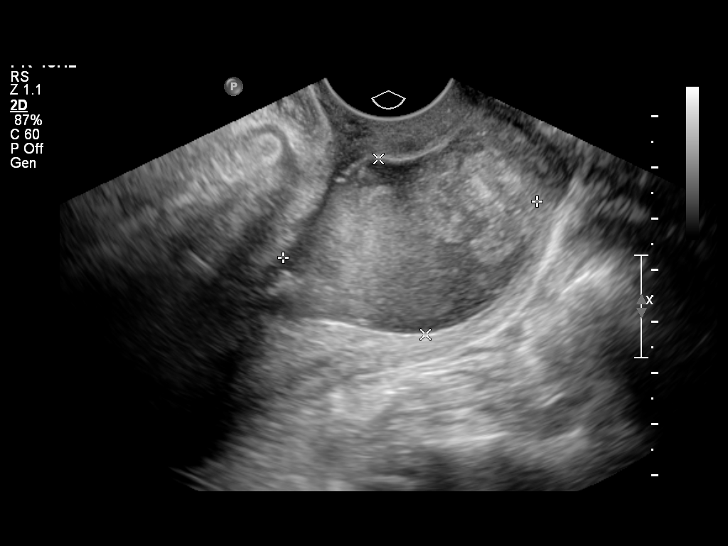
[im 45/54]
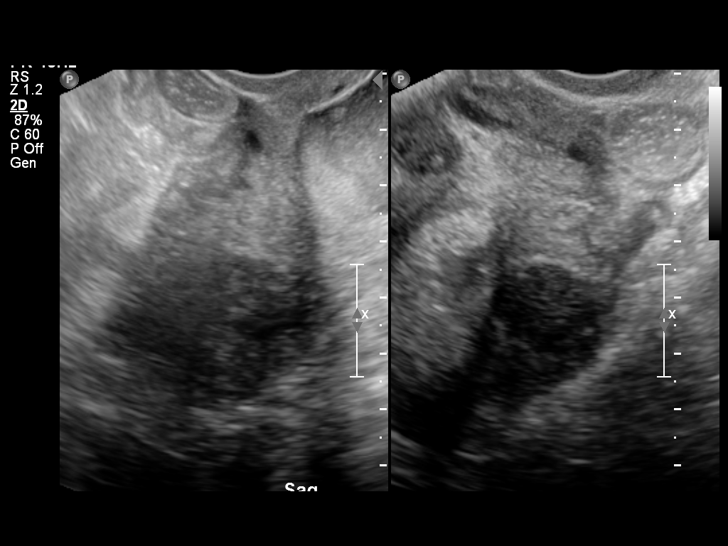
[im 49/54]
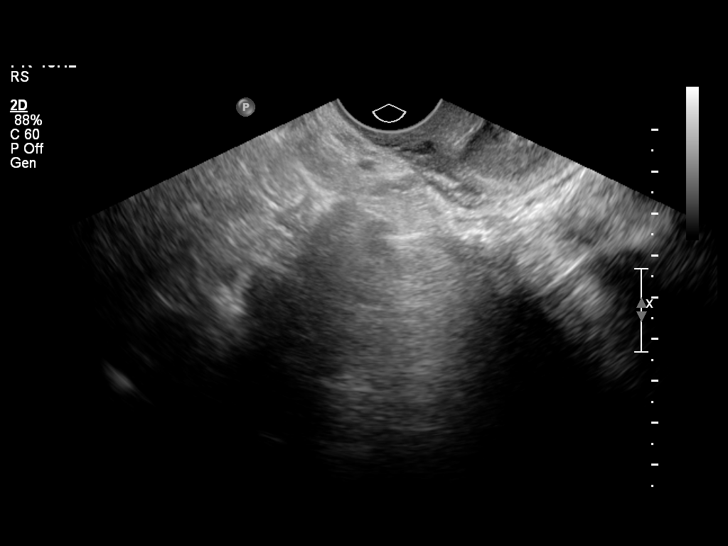
[im 54/54]
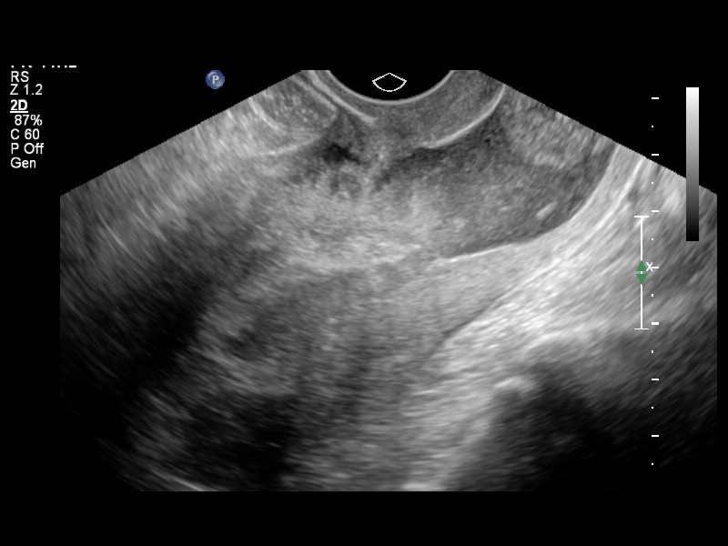

[13 of 25 positions shown; findings below may reference images not displayed]

It was necessary to proceed with endovaginal exam following the
transabdominal exam to visualize the the uterus and ovaries.
FINDINGS: Uterus: The uterus measures approximately 9.8 x 5.1 x 5.2 cm.  Two
separate definable hypoechoic fibroids are noted in a predominately
myometrial location at the level of the posterior fundus.  One of
these measures 2.0 x 1.6 x 2.3 cm and the second approximately
x 1.4 x 1.8 cm.

Endometrium: The endometrium is significantly thickened with
irregular hyperechoic products seen in a dilated endometrial
cavity.  Findings are suggestive of hematometrocolpos and this may
be on the basis of cervical stenosis.  Correlation suggested with
clinical exam.

Right ovary:  The right ovary measures approximately 3.4 x 1.4 x
2.6 cm and has a normal sonographic appearance.

Left ovary: The left ovary is only visualized by transabdominal
exam and is grossly normal measuring approximately 2.8 x 2.0 x
cm.

Other findings: No free fluid
IMPRESSION: 1.  Uterus shows two small fibroids.
2.  Heterogeneous and dilated endometrial canal containing what
appear to be blood products.  Findings are suggestive of
hematometrocolpos which may be on the basis of cervical stenosis.

## 2013-01-16 ENCOUNTER — Ambulatory Visit: Payer: Medicaid Other

## 2013-02-08 ENCOUNTER — Ambulatory Visit (INDEPENDENT_AMBULATORY_CARE_PROVIDER_SITE_OTHER): Payer: Medicaid Other

## 2013-02-08 VITALS — BP 152/110 | HR 86 | Wt 201.9 lb

## 2013-02-08 DIAGNOSIS — Z3202 Encounter for pregnancy test, result negative: Secondary | ICD-10-CM

## 2013-02-08 DIAGNOSIS — N920 Excessive and frequent menstruation with regular cycle: Secondary | ICD-10-CM

## 2013-02-08 LAB — POCT PREGNANCY, URINE: Preg Test, Ur: NEGATIVE

## 2013-02-08 MED ORDER — MEDROXYPROGESTERONE ACETATE 104 MG/0.65ML ~~LOC~~ SUSP
104.0000 mg | Freq: Once | SUBCUTANEOUS | Status: AC
  Administered 2013-02-08: 104 mg via SUBCUTANEOUS

## 2013-02-08 NOTE — Progress Notes (Signed)
Pt here for Depo Provera.  BP elevated.  Pt stated that she has not taken her BP medication cause she has yet to eat.  Per Leola Brazil, CNM it is ok to administer Depo Provera.  I advised pt on taking her medication as soon as possible.  Pt informed me that she was going to take it as soon as she got to her car cause it was in her purse.  Due to pt being late with receiving injection per Thressa Sheller, CNM due to pt receiving injection for bleeding and not contraception if pregnancy test is negative pt can have Depo Provera injection.  Pt also stated that she has not had intercourse.  Pregnancy test negative and admin Depo Provera as ordered.

## 2013-04-27 ENCOUNTER — Ambulatory Visit (INDEPENDENT_AMBULATORY_CARE_PROVIDER_SITE_OTHER): Payer: Medicaid Other | Admitting: Family Medicine

## 2013-04-27 VITALS — BP 133/80 | HR 87 | Temp 98.9°F | Ht 66.0 in | Wt 198.0 lb

## 2013-04-27 DIAGNOSIS — R51 Headache: Secondary | ICD-10-CM

## 2013-04-27 DIAGNOSIS — Z8669 Personal history of other diseases of the nervous system and sense organs: Secondary | ICD-10-CM

## 2013-04-27 NOTE — Progress Notes (Signed)
   Subjective:    Patient ID: Shirley Hernandez, female    DOB: November 30, 1970, 43 y.o.   MRN: [id number]  HPI 43 year old female with hypertension, diabetes mellitus, HLD, and history of migraine presents to the clinic today with complaints of headache.  1) Headache - Patient reports that she has had severe headache for the past 3 days. - Pain is located primarily in the frontal region and occipital region (bilaterally).  It is described as a severe ache.  - She's tried numerous over-the-counter medications to help relieve her pain - Advil, Tylenol, Aleve. She's not had any significant relief with these medications. - No known exacerbating factors. Relieved somewhat with rest and closing her eyes.  - She denies any associated nausea or vomiting. She does note some sensitivity to light.  Denies phonophobia, weakness, vision changes.  Review of Systems Per HPI    Objective:   Physical Exam Exam: General: well appearing female in NAD.  HEENT: NCAT. Normal TM's bilaterally.  Cardiovascular: RRR. No murmurs, rubs, or gallops. Respiratory: CTAB. No rales, rhonchi, or wheeze. Neuro: AO x 3. PERRLA. CN 2-12 grossly intact.  5/5 muscle strength in all extremities.      Assessment & Plan:  See Problem List

## 2013-04-27 NOTE — Patient Instructions (Signed)
It was nice to see you today.  Please let me know if your headache continues or worsens.  If it improves and you would like the medication that was given, please let me know.   Follow up as needed.

## 2013-04-28 ENCOUNTER — Telehealth: Payer: Self-pay | Admitting: Family Medicine

## 2013-04-28 DIAGNOSIS — R51 Headache: Secondary | ICD-10-CM | POA: Insufficient documentation

## 2013-04-28 DIAGNOSIS — R519 Headache, unspecified: Secondary | ICD-10-CM | POA: Insufficient documentation

## 2013-04-28 MED ORDER — SUMATRIPTAN SUCCINATE 6 MG/0.5ML ~~LOC~~ SOLN
6.0000 mg | Freq: Once | SUBCUTANEOUS | Status: AC
  Administered 2013-04-28: 6 mg via SUBCUTANEOUS

## 2013-04-28 MED ORDER — SUMATRIPTAN SUCCINATE 50 MG PO TABS: 50.0000 mg | ORAL_TABLET | ORAL | Status: DC | PRN

## 2013-04-28 NOTE — Telephone Encounter (Signed)
Patient calls again, states she still has a HA and would like the RX for Imitrex to be called in.

## 2013-04-28 NOTE — Telephone Encounter (Signed)
Dr Adriana Simasook as you requested patient would like rx for imitrex.thank you. Jermichael Belmares, Virgel BouquetGiovanna S

## 2013-04-28 NOTE — Telephone Encounter (Signed)
Rx sent 

## 2013-04-28 NOTE — Assessment & Plan Note (Signed)
Severe Tension headache vs. Atypical migraine. After discussion with attending Dr. Deirdre Priesthambliss, decided to treat with Imitrex subq.  Patient had improvement with Imitrex the patient was given Rx for this for future headaches. Patient to follow up as needed.

## 2013-04-28 NOTE — Telephone Encounter (Signed)
Pt called because she came in yesterday with a headache and was given medication to try. She said it did work and would like to have this prescribed. jw

## 2013-05-03 ENCOUNTER — Ambulatory Visit: Payer: Medicaid Other

## 2013-06-06 ENCOUNTER — Ambulatory Visit (INDEPENDENT_AMBULATORY_CARE_PROVIDER_SITE_OTHER): Payer: Medicaid Other

## 2013-06-06 VITALS — BP 153/103 | HR 89 | Wt 202.6 lb

## 2013-06-06 DIAGNOSIS — Z3202 Encounter for pregnancy test, result negative: Secondary | ICD-10-CM

## 2013-06-06 DIAGNOSIS — N898 Other specified noninflammatory disorders of vagina: Secondary | ICD-10-CM

## 2013-06-06 DIAGNOSIS — N939 Abnormal uterine and vaginal bleeding, unspecified: Secondary | ICD-10-CM

## 2013-06-06 DIAGNOSIS — N920 Excessive and frequent menstruation with regular cycle: Secondary | ICD-10-CM

## 2013-06-06 LAB — POCT PREGNANCY, URINE: Preg Test, Ur: NEGATIVE

## 2013-06-06 MED ORDER — MEDROXYPROGESTERONE ACETATE 104 MG/0.65ML ~~LOC~~ SUSP
104.0000 mg | Freq: Once | SUBCUTANEOUS | Status: AC
  Administered 2013-06-06: 104 mg via SUBCUTANEOUS

## 2013-06-06 NOTE — Progress Notes (Signed)
Pt here for Depo Provera for vaginal bleeding and has elevated BP's.  Pt informed me that she does not remember if she has taken her BP medication or not but her significant other states that she did not take her BP medication this am.  Per FAOZHYQWalidah, pt can have Depo and to take her BP medication asap, and to make sure that she follows up with her PCP.  Pt agreed.

## 2013-06-12 ENCOUNTER — Ambulatory Visit: Payer: Medicaid Other | Admitting: Family Medicine

## 2013-06-27 ENCOUNTER — Telehealth: Payer: Self-pay | Admitting: Family Medicine

## 2013-06-27 NOTE — Telephone Encounter (Signed)
Will forward to PCP 

## 2013-06-27 NOTE — Telephone Encounter (Signed)
Refill request for lisinopril, glipizide & Toprol.

## 2013-06-28 MED ORDER — GLIPIZIDE 5 MG PO TABS: 5.0000 mg | ORAL_TABLET | Freq: Two times a day (BID) | ORAL | Status: DC

## 2013-06-28 MED ORDER — METOPROLOL SUCCINATE ER 50 MG PO TB24: 50.0000 mg | ORAL_TABLET | Freq: Every day | ORAL | Status: DC

## 2013-06-28 MED ORDER — LISINOPRIL-HYDROCHLOROTHIAZIDE 20-25 MG PO TABS: ORAL_TABLET | ORAL | Status: DC

## 2013-06-28 NOTE — Telephone Encounter (Signed)
Rx's refilled. 

## 2013-08-29 ENCOUNTER — Ambulatory Visit: Payer: Medicaid Other

## 2013-09-11 ENCOUNTER — Telehealth: Payer: Self-pay | Admitting: Family Medicine

## 2013-09-13 NOTE — Telephone Encounter (Signed)
Call Pt about DM care. When Pt calls back please schedule appointment to check LDL, BP and A1C.

## 2013-09-20 ENCOUNTER — Ambulatory Visit (INDEPENDENT_AMBULATORY_CARE_PROVIDER_SITE_OTHER): Payer: Self-pay | Admitting: *Deleted

## 2013-09-20 ENCOUNTER — Other Ambulatory Visit (INDEPENDENT_AMBULATORY_CARE_PROVIDER_SITE_OTHER): Payer: Self-pay

## 2013-09-20 ENCOUNTER — Ambulatory Visit (INDEPENDENT_AMBULATORY_CARE_PROVIDER_SITE_OTHER): Payer: BLUE CROSS/BLUE SHIELD | Admitting: *Deleted

## 2013-09-20 ENCOUNTER — Encounter: Payer: Self-pay | Admitting: *Deleted

## 2013-09-20 VITALS — BP 150/98

## 2013-09-20 VITALS — BP 135/90 | HR 86

## 2013-09-20 DIAGNOSIS — E119 Type 2 diabetes mellitus without complications: Secondary | ICD-10-CM

## 2013-09-20 DIAGNOSIS — E785 Hyperlipidemia, unspecified: Secondary | ICD-10-CM

## 2013-09-20 DIAGNOSIS — Z3049 Encounter for surveillance of other contraceptives: Secondary | ICD-10-CM | POA: Diagnosis not present

## 2013-09-20 DIAGNOSIS — N920 Excessive and frequent menstruation with regular cycle: Secondary | ICD-10-CM | POA: Diagnosis not present

## 2013-09-20 DIAGNOSIS — N921 Excessive and frequent menstruation with irregular cycle: Secondary | ICD-10-CM

## 2013-09-20 DIAGNOSIS — I10 Essential (primary) hypertension: Secondary | ICD-10-CM

## 2013-09-20 LAB — POCT PREGNANCY, URINE: PREG TEST UR: NEGATIVE

## 2013-09-20 LAB — LDL CHOLESTEROL, DIRECT: Direct LDL: 113 mg/dL — ABNORMAL HIGH

## 2013-09-20 LAB — POCT GLYCOSYLATED HEMOGLOBIN (HGB A1C): HEMOGLOBIN A1C: 6.5

## 2013-09-20 MED ORDER — MEDROXYPROGESTERONE ACETATE 104 MG/0.65ML ~~LOC~~ SUSP
104.0000 mg | Freq: Once | SUBCUTANEOUS | Status: AC
  Administered 2013-09-20: 104 mg via SUBCUTANEOUS

## 2013-09-20 NOTE — Progress Notes (Unsigned)
D-LDL AND A1C DONE TODAY [name]

## 2013-09-20 NOTE — Progress Notes (Signed)
   Pt in nurse clinic for blood pressure.  Blood pressure with automatic machine 162/121, heart rate 108,  Manually 150/98.  Pt denies any symptoms today.  Pt took blood pressure medication as prescribed.  Pt scheduled an appt with PCP 09/21/2013 at 4 PM to discuss blood pressure and medication.  Will forward to PCP.  Clovis PuMartin, Luv Mish L, RN

## 2013-09-20 NOTE — Progress Notes (Signed)
Patient late for depo shot. UPT obtained. Patient has not had sex in the past two weeks. Test result negative. Depo given per protocol.

## 2013-09-21 ENCOUNTER — Ambulatory Visit (INDEPENDENT_AMBULATORY_CARE_PROVIDER_SITE_OTHER): Payer: Self-pay | Admitting: Family Medicine

## 2013-09-21 ENCOUNTER — Encounter: Payer: Self-pay | Admitting: Family Medicine

## 2013-09-21 VITALS — BP 152/110 | HR 92 | Ht 65.0 in | Wt 197.1 lb

## 2013-09-21 DIAGNOSIS — I10 Essential (primary) hypertension: Secondary | ICD-10-CM

## 2013-09-21 DIAGNOSIS — E119 Type 2 diabetes mellitus without complications: Secondary | ICD-10-CM

## 2013-09-21 MED ORDER — TRIAMCINOLONE ACETONIDE 0.025 % EX OINT: TOPICAL_OINTMENT | CUTANEOUS | Status: DC

## 2013-09-21 MED ORDER — AMLODIPINE BESYLATE-VALSARTAN 10-160 MG PO TABS: 1.0000 | ORAL_TABLET | Freq: Every day | ORAL | Status: DC

## 2013-09-21 MED ORDER — METOPROLOL SUCCINATE ER 50 MG PO TB24: 50.0000 mg | ORAL_TABLET | Freq: Every day | ORAL | Status: DC

## 2013-09-21 NOTE — Assessment & Plan Note (Signed)
A1c 6.5. Patient to continue glipizide 5 mg daily.  Patient in need of diabetic foot exam and retinal scan later this year.

## 2013-09-21 NOTE — Patient Instructions (Signed)
It was nice to see you today.  Regarding your HTN - Take the Exforge and the Metoprolol as prescribed.  Regarding your DM - Take the Glipizide 5 mg daily.  Follow up in 3 months.

## 2013-09-21 NOTE — Assessment & Plan Note (Addendum)
Patient endorses compliance with medication. Blood pressure still uncontrolled. Patient would like to switch to Exforge today. Rx sent. I also advised her to continue taking the metoprolol. Followup in 3 months.

## 2013-09-21 NOTE — Progress Notes (Signed)
   Subjective:    Patient ID: Shirley Hernandez, female    DOB: 04-15-1970, 43 y.o.   MRN: [id number]  HPI 43 year old female with basilar history of hypertension and diabetes presents for followup.  1) HTN - Patient was recently seen by our RN for blood pressure check, and was found to have significantly elevated BP at that time. - She does not regularly monitor her blood pressure. - Medications: Lisinopril/HCTZ, metoprolol. - Compliance -  patient endorses compliance. ROS: Denies chest pain, SOB, lightheadedness/dizziness   2) Diabetes mellitus, type 2 - Patient does not check her CBG's  - Medications/Compliance:  Patient takes 5 mg of glipizide daily and reports compliance. - Medication Side Effects: Patient reports that when she takes glipizide twice a day she feels "jittery"  Review of Systems Per HPI    Objective:   Physical Exam Filed Vitals:   09/21/13 1550  BP: 152/110  Pulse: 92   Exam: General: Well-appearing obese female in no acute distress.    Assessment & Plan:  See Problem List

## 2013-11-07 ENCOUNTER — Telehealth: Payer: Self-pay | Admitting: Pediatrics

## 2013-11-07 DIAGNOSIS — Z2089 Contact with and (suspected) exposure to other communicable diseases: Secondary | ICD-10-CM

## 2013-11-07 DIAGNOSIS — Z207 Contact with and (suspected) exposure to pediculosis, acariasis and other infestations: Secondary | ICD-10-CM

## 2013-11-07 MED ORDER — PERMETHRIN 5 % EX CREA: 1.0000 "application " | TOPICAL_CREAM | Freq: Once | CUTANEOUS | Status: DC

## 2013-11-07 NOTE — Telephone Encounter (Signed)
Dovey's grandson Shirley Repressevin Jr. was seen at [facility] and was diagnosed with scabies. This medicine was prescribed to everyone living at home with him to eradicate the household of scabies.  [name], [name], MD PGY-2 Pediatrics [facility]

## 2013-12-08 ENCOUNTER — Telehealth: Payer: Self-pay | Admitting: Family Medicine

## 2013-12-08 MED ORDER — PIMECROLIMUS 1 % EX CREA: TOPICAL_CREAM | Freq: Two times a day (BID) | CUTANEOUS | Status: DC

## 2013-12-08 NOTE — Telephone Encounter (Signed)
Pt called and would like to have Elidel called in for her eczema called since this works better for her . jw

## 2013-12-08 NOTE — Telephone Encounter (Signed)
Rx sent 

## 2013-12-13 ENCOUNTER — Ambulatory Visit (INDEPENDENT_AMBULATORY_CARE_PROVIDER_SITE_OTHER): Payer: BLUE CROSS/BLUE SHIELD

## 2013-12-13 VITALS — BP 137/95 | HR 97 | Temp 98.5°F | Wt 194.3 lb

## 2013-12-13 DIAGNOSIS — Z3049 Encounter for surveillance of other contraceptives: Secondary | ICD-10-CM | POA: Diagnosis not present

## 2013-12-13 MED ORDER — MEDROXYPROGESTERONE ACETATE 104 MG/0.65ML ~~LOC~~ SUSP
104.0000 mg | Freq: Once | SUBCUTANEOUS | Status: AC
  Administered 2013-12-13: 104 mg via SUBCUTANEOUS

## 2013-12-13 NOTE — Progress Notes (Signed)
Patient here today for depo provera injection-- within appropriate time frame.  Depo provera administered subcutaneously into LLQ abdomen. Patient tolerated well. Patient states she has been wanting to get off depo for a while but bleeds when she is not on it-- has been on it for 17 years. Advised patient to make next depo appointment and if she decides she would like to meet with a provider to discuss other options she should call at the end of October for a visit in December. Patient verbalized understanding and gratitude. No other questions, concerns or problems.

## 2014-01-22 ENCOUNTER — Encounter: Payer: Self-pay | Admitting: Family Medicine

## 2014-03-07 ENCOUNTER — Encounter: Payer: Self-pay | Admitting: *Deleted

## 2014-03-07 ENCOUNTER — Ambulatory Visit (INDEPENDENT_AMBULATORY_CARE_PROVIDER_SITE_OTHER): Payer: BLUE CROSS/BLUE SHIELD | Admitting: *Deleted

## 2014-03-07 VITALS — BP 149/97 | HR 92 | Temp 98.5°F | Wt 202.6 lb

## 2014-03-07 DIAGNOSIS — Z3042 Encounter for surveillance of injectable contraceptive: Secondary | ICD-10-CM | POA: Diagnosis not present

## 2014-03-07 DIAGNOSIS — N938 Other specified abnormal uterine and vaginal bleeding: Secondary | ICD-10-CM | POA: Diagnosis not present

## 2014-03-07 MED ORDER — MEDROXYPROGESTERONE ACETATE 104 MG/0.65ML ~~LOC~~ SUSP
104.0000 mg | Freq: Once | SUBCUTANEOUS | Status: AC
  Administered 2014-03-07: 104 mg via SUBCUTANEOUS

## 2014-04-18 ENCOUNTER — Ambulatory Visit: Payer: BLUE CROSS/BLUE SHIELD | Admitting: Family Medicine

## 2014-05-28 ENCOUNTER — Ambulatory Visit (INDEPENDENT_AMBULATORY_CARE_PROVIDER_SITE_OTHER): Payer: BLUE CROSS/BLUE SHIELD | Admitting: General Practice

## 2014-05-28 VITALS — BP 145/88 | HR 92 | Temp 98.0°F | Ht 65.0 in | Wt 199.4 lb

## 2014-05-28 DIAGNOSIS — N920 Excessive and frequent menstruation with regular cycle: Secondary | ICD-10-CM

## 2014-05-28 MED ORDER — MEDROXYPROGESTERONE ACETATE 104 MG/0.65ML ~~LOC~~ SUSP
104.0000 mg | Freq: Once | SUBCUTANEOUS | Status: AC
  Administered 2014-05-28: 104 mg via SUBCUTANEOUS

## 2014-05-28 NOTE — Progress Notes (Signed)
Discussed with patient that she needs to schedule an appt with a provider as she has not been seen since 2013 to ensure depo is continuing to be the best option for her. Patient verbalized understanding and states she will schedule appt

## 2014-06-28 ENCOUNTER — Ambulatory Visit: Payer: BLUE CROSS/BLUE SHIELD | Admitting: Obstetrics and Gynecology

## 2014-08-22 ENCOUNTER — Ambulatory Visit: Payer: BLUE CROSS/BLUE SHIELD

## 2014-09-10 ENCOUNTER — Encounter: Payer: Self-pay | Admitting: Family Medicine

## 2014-09-10 ENCOUNTER — Encounter (HOSPITAL_COMMUNITY): Payer: Self-pay | Admitting: *Deleted

## 2014-09-10 ENCOUNTER — Emergency Department (HOSPITAL_COMMUNITY)
Admission: EM | Admit: 2014-09-10 | Discharge: 2014-09-10 | Discharge status: Against Medical Advice | Payer: BLUE CROSS/BLUE SHIELD | Attending: Emergency Medicine | Admitting: Emergency Medicine

## 2014-09-10 ENCOUNTER — Ambulatory Visit (INDEPENDENT_AMBULATORY_CARE_PROVIDER_SITE_OTHER): Payer: BLUE CROSS/BLUE SHIELD | Admitting: Family Medicine

## 2014-09-10 VITALS — BP 140/98 | Temp 98.3°F | Wt 201.7 lb

## 2014-09-10 DIAGNOSIS — S99922D Unspecified injury of left foot, subsequent encounter: Secondary | ICD-10-CM | POA: Diagnosis not present

## 2014-09-10 DIAGNOSIS — Z720 Tobacco use: Secondary | ICD-10-CM | POA: Diagnosis not present

## 2014-09-10 DIAGNOSIS — E119 Type 2 diabetes mellitus without complications: Secondary | ICD-10-CM | POA: Insufficient documentation

## 2014-09-10 DIAGNOSIS — I10 Essential (primary) hypertension: Secondary | ICD-10-CM | POA: Insufficient documentation

## 2014-09-10 DIAGNOSIS — W07XXXA Fall from chair, initial encounter: Secondary | ICD-10-CM | POA: Diagnosis not present

## 2014-09-10 DIAGNOSIS — Y929 Unspecified place or not applicable: Secondary | ICD-10-CM | POA: Insufficient documentation

## 2014-09-10 DIAGNOSIS — S99922A Unspecified injury of left foot, initial encounter: Secondary | ICD-10-CM | POA: Diagnosis not present

## 2014-09-10 DIAGNOSIS — Y939 Activity, unspecified: Secondary | ICD-10-CM | POA: Diagnosis not present

## 2014-09-10 DIAGNOSIS — Y999 Unspecified external cause status: Secondary | ICD-10-CM | POA: Diagnosis not present

## 2014-09-10 DIAGNOSIS — M79672 Pain in left foot: Secondary | ICD-10-CM

## 2014-09-10 DIAGNOSIS — M79673 Pain in unspecified foot: Secondary | ICD-10-CM

## 2014-09-10 MED ORDER — PIMECROLIMUS 1 % EX CREA: TOPICAL_CREAM | Freq: Two times a day (BID) | CUTANEOUS | Status: DC

## 2014-09-10 NOTE — ED Notes (Signed)
Pt stated she was sent here by her doctor and was thnking she was only here to get an xray, pt left sue to needing to pick husband up from work. Pt stated that she will follow up again with her doctor. Made Hope, NP aware.

## 2014-09-10 NOTE — ED Notes (Signed)
Pt left AMA °

## 2014-09-10 NOTE — ED Provider Notes (Signed)
Shirley Hernandez is a 44 y.o. female here from her MD office for x-ray of the left foot. Went into room to see patient and she had left. Told the ENT that she thought she was only here for out patient x-ray and was not going to stay. Stated that she would return to her PCP and order for out patient films.  BP 138/104 mmHg  Pulse 98  Temp(Src) 98.3 F (36.8 C) (Oral)  Resp 16  SpO2 98%    [facility], NP 09/10/14 1803  Lorre Nick, MD 09/13/14 [phone]

## 2014-09-10 NOTE — Patient Instructions (Signed)
It was nice to see you today.  Please get your x-ray. He may return to work unless your hear from me otherwise.  Follow up annually.   Take care  Dr. Adriana Simas

## 2014-09-10 NOTE — ED Notes (Signed)
Pt states that she was sent her by her dr for a left foot xray by her MD. Pt had a chair fall on it on the 8th and has continues to have pain.

## 2014-09-11 DIAGNOSIS — S99922A Unspecified injury of left foot, initial encounter: Secondary | ICD-10-CM | POA: Insufficient documentation

## 2014-09-11 NOTE — Progress Notes (Signed)
   Subjective:    Patient ID: Shirley Hernandez, female    DOB: 12-07-1970, 44 y.o.   MRN: [id number]  HPI 44 year old female presents to clinic today for evaluation of left foot pain following a recent injury.  Patient reports that she injured her left foot on 6/7. She states that she dropped a chair on her foot. She subsequently developed swelling and some difficulty bearing weight which quickly resolved. She went to a local emergency room (not in our system) and was evaluated. X-ray was found to be normal. She was placed on crutches and told to follow up.  Patient presents today for repeat evaluation. Patient would like to go back to work at this time. She currently has some mild pain located on dorsal midfoot. Range of motion is normal. No other complaints at this time.  Social Hx - Everyday smoker.  Review of Systems  Constitutional: Negative for fever and chills.  Musculoskeletal: Positive for arthralgias.  Skin: Negative.       Objective:   Physical Exam Filed Vitals:   09/10/14 1609  BP: 140/98  Temp: 98.3 F (36.8 C)   Vital signs reviewed.  Exam: General: well appearing, NAD. MSK: Left foot and ankle - slightly to palpation at the dorsal midfoot. Normal range of motion. No noted swelling or ecchymosis. No warmth noted.    Assessment & Plan:  See Problem List

## 2014-09-11 NOTE — Assessment & Plan Note (Signed)
Patient with some continued pain/tenderness at the lateral dorsal midfoot. Repeating x-ray to ensure no underlying fracture. Patient is okay to return to work and patient was given a note for this. Will inform patient of x-ray result once it is been obtained and reviewed.

## 2014-09-13 ENCOUNTER — Telehealth: Payer: Self-pay | Admitting: *Deleted

## 2014-09-13 NOTE — Telephone Encounter (Signed)
Prior Authorization received from [employer] for Elidel 1% cream. PA form completed and faxed to [facility] [place] for review. Clovis Pu, RN

## 2014-09-14 ENCOUNTER — Telehealth: Payer: Self-pay | Admitting: Family Medicine

## 2014-09-14 NOTE — Telephone Encounter (Signed)
Will forward to MD to see if he can call in the Elocon instead?  Jazmin Hartsell,CMA

## 2014-09-14 NOTE — Telephone Encounter (Signed)
Pt called because she was seen on 09/10/14 by Dr. Adriana Simas and she wanted him to call in [facility] instead of the cream he did call in. jw

## 2014-09-14 NOTE — Telephone Encounter (Signed)
Received PA approval for Elidel via [employer] of [place].  Med approved for 09/13/14 - 09/12/15.  [facility] pharmacy informed.  PA reference number [id number]. Clovis Pu, RN

## 2014-09-17 NOTE — Telephone Encounter (Signed)
I sent in the Elocon. Prior Auth was approved and this is the likely reason for the delay. Pt can pick up at pharmacy.

## 2014-09-17 NOTE — Telephone Encounter (Signed)
Pt informed. Shirley Hernandez, Cherish Runde D, CMA  

## 2014-10-04 ENCOUNTER — Telehealth: Payer: Self-pay | Admitting: Family Medicine

## 2014-10-04 ENCOUNTER — Ambulatory Visit (INDEPENDENT_AMBULATORY_CARE_PROVIDER_SITE_OTHER): Payer: BLUE CROSS/BLUE SHIELD | Admitting: *Deleted

## 2014-10-04 VITALS — BP 155/104 | HR 90

## 2014-10-04 DIAGNOSIS — Z3042 Encounter for surveillance of injectable contraceptive: Secondary | ICD-10-CM | POA: Diagnosis not present

## 2014-10-04 DIAGNOSIS — Z3202 Encounter for pregnancy test, result negative: Secondary | ICD-10-CM | POA: Diagnosis not present

## 2014-10-04 LAB — POCT PREGNANCY, URINE

## 2014-10-04 MED ORDER — MEDROXYPROGESTERONE ACETATE 150 MG/ML IM SUSP
150.0000 mg | Freq: Once | INTRAMUSCULAR | Status: AC
  Administered 2014-10-04: 150 mg via INTRAMUSCULAR

## 2014-10-04 NOTE — Telephone Encounter (Signed)
Ms. Shirley Hernandez is dropping off forms to be completed to document from where she was out of work for a foot injury. She was seen on 09/20/2014 for this injury. Please contact patient when ready for pick up. Sadie Reynolds, ASA

## 2014-10-05 NOTE — Telephone Encounter (Signed)
Forms put in PCP box. Sunday SpillersSharon T Taurus Hernandez, CMA

## 2014-10-24 ENCOUNTER — Ambulatory Visit: Payer: BLUE CROSS/BLUE SHIELD | Admitting: Family Medicine

## 2014-10-26 ENCOUNTER — Other Ambulatory Visit: Payer: Self-pay | Admitting: Family Medicine

## 2014-10-31 ENCOUNTER — Telehealth: Payer: Self-pay | Admitting: Family Medicine

## 2014-10-31 NOTE — Telephone Encounter (Signed)
Needs refill on toprol and exforge [facility] Please advise

## 2014-11-06 ENCOUNTER — Other Ambulatory Visit: Payer: Self-pay | Admitting: *Deleted

## 2014-11-06 MED ORDER — AMLODIPINE BESYLATE-VALSARTAN 10-160 MG PO TABS: 1.0000 | ORAL_TABLET | Freq: Every day | ORAL | Status: DC

## 2014-11-06 MED ORDER — METOPROLOL SUCCINATE ER 50 MG PO TB24: 50.0000 mg | ORAL_TABLET | Freq: Every day | ORAL | Status: DC

## 2014-11-13 ENCOUNTER — Other Ambulatory Visit: Payer: Self-pay | Admitting: Family Medicine

## 2014-11-13 MED ORDER — SUMATRIPTAN SUCCINATE 50 MG PO TABS: 50.0000 mg | ORAL_TABLET | ORAL | Status: DC | PRN

## 2014-11-13 NOTE — Telephone Encounter (Signed)
Would like a refill on Imitrex and needs it sent to [facility] located at [address], [place], [place] [address]; phone: [phone]

## 2014-12-20 ENCOUNTER — Ambulatory Visit (INDEPENDENT_AMBULATORY_CARE_PROVIDER_SITE_OTHER): Payer: BLUE CROSS/BLUE SHIELD | Admitting: *Deleted

## 2014-12-20 DIAGNOSIS — N938 Other specified abnormal uterine and vaginal bleeding: Secondary | ICD-10-CM | POA: Diagnosis not present

## 2014-12-20 MED ORDER — MEDROXYPROGESTERONE ACETATE 150 MG/ML IM SUSP
150.0000 mg | Freq: Once | INTRAMUSCULAR | Status: AC
  Administered 2014-12-20: 150 mg via INTRAMUSCULAR

## 2015-03-11 ENCOUNTER — Ambulatory Visit (INDEPENDENT_AMBULATORY_CARE_PROVIDER_SITE_OTHER): Payer: BLUE CROSS/BLUE SHIELD

## 2015-03-11 VITALS — BP 138/99 | HR 88 | Wt 203.9 lb

## 2015-03-11 DIAGNOSIS — N938 Other specified abnormal uterine and vaginal bleeding: Secondary | ICD-10-CM | POA: Diagnosis not present

## 2015-03-11 MED ORDER — MEDROXYPROGESTERONE ACETATE 150 MG/ML IM SUSP
150.0000 mg | Freq: Once | INTRAMUSCULAR | Status: AC
  Administered 2015-03-11: 150 mg via INTRAMUSCULAR

## 2015-05-27 ENCOUNTER — Ambulatory Visit (INDEPENDENT_AMBULATORY_CARE_PROVIDER_SITE_OTHER): Payer: BLUE CROSS/BLUE SHIELD

## 2015-05-27 VITALS — BP 132/89 | HR 76

## 2015-05-27 DIAGNOSIS — N921 Excessive and frequent menstruation with irregular cycle: Secondary | ICD-10-CM

## 2015-05-27 MED ORDER — MEDROXYPROGESTERONE ACETATE 150 MG/ML IM SUSP
150.0000 mg | Freq: Once | INTRAMUSCULAR | Status: AC
  Administered 2015-05-27: 150 mg via INTRAMUSCULAR

## 2015-07-20 ENCOUNTER — Encounter (HOSPITAL_COMMUNITY): Payer: Self-pay | Admitting: *Deleted

## 2015-07-20 DIAGNOSIS — E119 Type 2 diabetes mellitus without complications: Secondary | ICD-10-CM | POA: Diagnosis not present

## 2015-07-20 DIAGNOSIS — Z7984 Long term (current) use of oral hypoglycemic drugs: Secondary | ICD-10-CM | POA: Diagnosis not present

## 2015-07-20 DIAGNOSIS — G43909 Migraine, unspecified, not intractable, without status migrainosus: Secondary | ICD-10-CM | POA: Insufficient documentation

## 2015-07-20 DIAGNOSIS — I10 Essential (primary) hypertension: Secondary | ICD-10-CM | POA: Diagnosis not present

## 2015-07-20 DIAGNOSIS — F17210 Nicotine dependence, cigarettes, uncomplicated: Secondary | ICD-10-CM | POA: Insufficient documentation

## 2015-07-20 DIAGNOSIS — Z79899 Other long term (current) drug therapy: Secondary | ICD-10-CM | POA: Insufficient documentation

## 2015-07-20 DIAGNOSIS — Z8659 Personal history of other mental and behavioral disorders: Secondary | ICD-10-CM | POA: Diagnosis not present

## 2015-07-20 DIAGNOSIS — R51 Headache: Secondary | ICD-10-CM | POA: Diagnosis present

## 2015-07-20 NOTE — ED Notes (Signed)
Headache since yesterday no n v  She takes imitrex for the  Headaches.  lmp none depo

## 2015-07-21 ENCOUNTER — Emergency Department (HOSPITAL_COMMUNITY)
Admission: EM | Admit: 2015-07-21 | Discharge: 2015-07-21 | Disposition: A | Discharge status: Home / Self Care | Payer: BLUE CROSS/BLUE SHIELD | Attending: Emergency Medicine | Admitting: Emergency Medicine

## 2015-07-21 DIAGNOSIS — G43909 Migraine, unspecified, not intractable, without status migrainosus: Secondary | ICD-10-CM

## 2015-07-21 NOTE — ED Notes (Signed)
Dr. Nanavati at bedside at this time.  

## 2015-07-21 NOTE — Discharge Instructions (Signed)
Migraine Headache °A migraine headache is an intense, throbbing pain on one or both sides of your head. A migraine can last for 30 minutes to several hours. °CAUSES  °The exact cause of a migraine headache is not always known. However, a migraine may be caused when nerves in the brain become irritated and release chemicals that cause inflammation. This causes pain. °Certain things may also trigger migraines, such as: °· Alcohol. °· Smoking. °· Stress. °· Menstruation. °· Aged cheeses. °· Foods or drinks that contain nitrates, glutamate, aspartame, or tyramine. °· Lack of sleep. °· Chocolate. °· Caffeine. °· Hunger. °· Physical exertion. °· Fatigue. °· Medicines used to treat chest pain (nitroglycerine), birth control pills, estrogen, and some blood pressure medicines. °SIGNS AND SYMPTOMS °· Pain on one or both sides of your head. °· Pulsating or throbbing pain. °· Severe pain that prevents daily activities. °· Pain that is aggravated by any physical activity. °· Nausea, vomiting, or both. °· Dizziness. °· Pain with exposure to bright lights, loud noises, or activity. °· General sensitivity to bright lights, loud noises, or smells. °Before you get a migraine, you may get warning signs that a migraine is coming (aura). An aura may include: °· Seeing flashing lights. °· Seeing bright spots, halos, or zigzag lines. °· Having tunnel vision or blurred vision. °· Having feelings of numbness or tingling. °· Having trouble talking. °· Having muscle weakness. °DIAGNOSIS  °A migraine headache is often diagnosed based on: °· Symptoms. °· Physical exam. °· A CT scan or MRI of your head. These imaging tests cannot diagnose migraines, but they can help rule out other causes of headaches. °TREATMENT °Medicines may be given for pain and nausea. Medicines can also be given to help prevent recurrent migraines.  °HOME CARE INSTRUCTIONS °· Only take over-the-counter or prescription medicines for pain or discomfort as directed by your  health care provider. The use of long-term narcotics is not recommended. °· Lie down in a dark, quiet room when you have a migraine. °· Keep a journal to find out what may trigger your migraine headaches. For example, write down: °¨ What you eat and drink. °¨ How much sleep you get. °¨ Any change to your diet or medicines. °· Limit alcohol consumption. °· Quit smoking if you smoke. °· Get 7-9 hours of sleep, or as recommended by your health care provider. °· Limit stress. °· Keep lights dim if bright lights bother you and make your migraines worse. °SEEK IMMEDIATE MEDICAL CARE IF:  °· Your migraine becomes severe. °· You have a fever. °· You have a stiff neck. °· You have vision loss. °· You have muscular weakness or loss of muscle control. °· You start losing your balance or have trouble walking. °· You feel faint or pass out. °· You have severe symptoms that are different from your first symptoms. °MAKE SURE YOU:  °· Understand these instructions. °· Will watch your condition. °· Will get help right away if you are not doing well or get worse. °  °This information is not intended to replace advice given to you by your health care provider. Make sure you discuss any questions you have with your health care provider. °  °Document Released: 03/09/2005 Document Revised: 03/30/2014 Document Reviewed: 11/14/2012 °Elsevier Interactive Patient Education ©2016 [employer] ° °Recurrent Migraine Headache °A migraine headache is an intense, throbbing pain on one or both sides of your head. Recurrent migraines keep coming back. A migraine can last for 30 minutes to several hours. °  CAUSES  °The exact cause of a migraine headache is not always known. However, a migraine may be caused when nerves in the brain become irritated and release chemicals that cause inflammation. This causes pain. °Certain things may also trigger migraines, such as:  °· Alcohol. °· Smoking. °· Stress. °· Menstruation. °· Aged cheeses. °· Foods or  drinks that contain nitrates, glutamate, aspartame, or tyramine. °· Lack of sleep. °· Chocolate. °· Caffeine. °· Hunger. °· Physical exertion. °· Fatigue. °· Medicines used to treat chest pain (nitroglycerine), birth control pills, estrogen, and some blood pressure medicines. °SYMPTOMS  °· Pain on one or both sides of your head. °· Pulsating or throbbing pain. °· Severe pain that prevents daily activities. °· Pain that is aggravated by any physical activity. °· Nausea, vomiting, or both. °· Dizziness. °· Pain with exposure to bright lights, loud noises, or activity. °· General sensitivity to bright lights, loud noises, or smells. °Before you get a migraine, you may get warning signs that a migraine is coming (aura). An aura may include: °· Seeing flashing lights. °· Seeing bright spots, halos, or zigzag lines. °· Having tunnel vision or blurred vision. °· Having feelings of numbness or tingling. °· Having trouble talking. °· Having muscle weakness. °DIAGNOSIS  °A recurrent migraine headache is often diagnosed based on: °· Symptoms. °· Physical examination. °· A CT scan or MRI of your head. These imaging tests cannot diagnose migraines but can help rule out other causes of headaches.   °TREATMENT  °Medicines may be given for pain and nausea. Medicines can also be given to help prevent recurrent migraines. °HOME CARE INSTRUCTIONS °· Only take over-the-counter or prescription medicines for pain or discomfort as directed by your health care provider. The use of long-term narcotics is not recommended. °· Lie down in a dark, quiet room when you have a migraine. °· Keep a journal to find out what may trigger your migraine headaches. For example, write down: °¨ What you eat and drink. °¨ How much sleep you get. °¨ Any change to your diet or medicines. °· Limit alcohol consumption. °· Quit smoking if you smoke. °· Get 7-9 hours of sleep, or as recommended by your health care provider. °· Limit stress. °· Keep lights dim if  bright lights bother you and make your migraines worse. °SEEK MEDICAL CARE IF:  °· You do not get relief from the medicines given to you. °· You have a recurrence of pain. °· You have a fever. °SEEK IMMEDIATE MEDICAL CARE IF: °· Your migraine becomes severe. °· You have a stiff neck. °· You have loss of vision. °· You have muscular weakness or loss of muscle control. °· You start losing your balance or have trouble walking. °· You feel faint or pass out. °· You have severe symptoms that are different from your first symptoms. °MAKE SURE YOU:  °· Understand these instructions. °· Will watch your condition. °· Will get help right away if you are not doing well or get worse. °  °This information is not intended to replace advice given to you by your health care provider. Make sure you discuss any questions you have with your health care provider. °  °Document Released: 12/02/2000 Document Revised: 03/30/2014 Document Reviewed: 11/14/2012 °Elsevier Interactive Patient Education ©2016 Elsevier Inc. ° °

## 2015-07-21 NOTE — ED Provider Notes (Signed)
CSN: [id number]     Arrival date & time 07/20/15  2139 History  By signing my name below, I, [name], attest that this documentation has been prepared under the direction and in the presence of Derwood Kaplan, MD. Electronically Signed: Randell Patient, ED Scribe. 07/21/2015. 1:52 AM.   Chief Complaint  Patient presents with  . Headache   The history is provided by the patient. No language interpreter was used.  HPI Comments: [place] is a 45 y.o. female with a hx of migraines who presents to the Emergency Department complaining of intermittent, gradually improving HAs to her bilateral temples onset yesterday. Patient states that she works as a Wellsite geologist in [employer] and came to the ED tonight in order to get a work note. She has taken Imitrex with relief of her HA. Denies nausea, vomiting, photophobia.  Past Medical History  Diagnosis Date  . Hypertension   . Headache(784.0)   . Diabetes mellitus   . Anxiety   . Depression    Past Surgical History  Procedure Laterality Date  . Dilation and curettage of uterus    . Dilation and curettage of uterus  03/23/2011    Procedure: DILATATION AND CURETTAGE;  Surgeon: Myra C. Marice Potter, MD;  Location: [facility];  Service: Gynecology;  Laterality: N/A;   Family History  Problem Relation Age of Onset  . Diabetes Mother   . Hypertension Mother   . Stroke Mother   . Hypertension Father   . Depression Father   . Diabetes Father   . Hypertension Sister   . Diabetes Sister   . Hypertension Brother    Social History  Substance Use Topics  . Smoking status: Current Some Day Smoker -- 0.50 packs/day for 12 years    Types: Cigarettes  . Smokeless tobacco: None  . Alcohol Use: Yes     Comment: socially   OB History    Gravida Para Term Preterm AB TAB SAB Ectopic Multiple Living   Review of Systems A complete 10 system review of systems was obtained and all systems are negative except as noted in the  HPI and PMH.   Allergies  Metformin and related  Home Medications   Prior to Admission medications   Medication Sig Start Date End Date Taking? Authorizing Provider  acetaminophen (TYLENOL) 500 MG tablet Take 1,000 mg by mouth every 6 (six) hours as needed for mild pain.    Yes Historical Provider, MD  amLODipine-valsartan (EXFORGE) 10-160 MG per tablet Take 1 tablet by mouth daily. 11/06/14  Yes [place] N Rumley, DO  glipiZIDE (GLUCOTROL) 5 MG tablet Take 1 tablet (5 mg total) by mouth 2 (two) times daily before a meal. 06/28/13  Yes Jayce G Cook, DO  metoprolol succinate (TOPROL-XL) 50 MG 24 hr tablet Take 1 tablet (50 mg total) by mouth daily. 11/06/14  Yes [place] N Rumley, DO  SUMAtriptan (IMITREX) 50 MG tablet Take 1 tablet (50 mg total) by mouth every 2 (two) hours as needed for migraine. May repeat in 2 hours if headache persists or recurs. 11/13/14  Yes Toy[place] N Rumley, DO  triamcinolone (KENALOG) 0.025 % ointment Apply to affected areas daily as needed. Patient taking differently: Apply 1 application topically daily as needed (eczema).  09/21/13  Yes Jayce G Cook, DO   BP 140/99 mmHg  Pulse 94  Temp(Src) 99.1 F (37.3 C) (Oral)  Resp 20  Ht 5'  4" (1.626 m)  Wt 213 lb (96.616 kg)  BMI 36.54 kg/m2  SpO2 98% Physical Exam  Constitutional: She is oriented to person, place, and time. She appears well-developed and well-nourished.  HENT:  Head: Normocephalic.  Eyes: EOM are normal.  Neck: Normal range of motion.  Pulmonary/Chest: Effort normal.  Abdominal: She exhibits no distension.  Musculoskeletal: Normal range of motion.  Neurological: She is alert and oriented to person, place, and time. No cranial nerve deficit. Coordination normal.  Psychiatric: She has a normal mood and affect.  Nursing note and vitals reviewed.   ED Course  Procedures   DIAGNOSTIC STUDIES: Oxygen Saturation is 98% on RA, normal by my interpretation.    COORDINATION OF CARE: 1:46 AM Will provide  pt with work note and discharge home. Discussed treatment plan with pt at bedside and pt agreed to plan.  MDM   Final diagnoses:  Migraine without status migrainosus, not intractable, unspecified migraine type    I personally performed the services described in this documentation, which was scribed in my presence. The recorded information has been reviewed and is accurate.  Headache - no acute findings. Normal neuro. Pain is not severe. Will d/c - main reason for ER visit is work note.   Derwood KaplanAnkit Adya Wirz, MD 07/21/15 [phone]

## 2015-08-02 ENCOUNTER — Telehealth: Payer: Self-pay | Admitting: Family Medicine

## 2015-08-02 NOTE — Telephone Encounter (Signed)
Pt came by the office and dropped off FMLA papers to be filled out. Please call when ready to pick up. jw

## 2015-08-06 NOTE — Telephone Encounter (Signed)
-----   Message from [facility] HospitalR[facility] N Rumley, [place] sent at 08/05/2015 12:11 PM EDT ----- Please contact to schedule appointment to complete FMLA paperwork dropped off.

## 2015-08-06 NOTE — Telephone Encounter (Signed)
LVm for pt to call back to inform of below and to see about getting this scheduled. Lamonte SakaiZimmerman Rumple, Miroslav Gin D, [place]

## 2015-08-07 NOTE — Telephone Encounter (Signed)
LVM for pt to call. If pt calls, please make her an apt to meet with Dr to fill out FMLA paperowork. Sunday SpillersSharon T Saunders, CMA

## 2015-08-12 ENCOUNTER — Ambulatory Visit (INDEPENDENT_AMBULATORY_CARE_PROVIDER_SITE_OTHER): Payer: BLUE CROSS/BLUE SHIELD | Admitting: General Practice

## 2015-08-12 VITALS — BP 144/97 | HR 80 | Ht 64.0 in | Wt 201.0 lb

## 2015-08-12 DIAGNOSIS — N938 Other specified abnormal uterine and vaginal bleeding: Secondary | ICD-10-CM | POA: Diagnosis not present

## 2015-08-12 MED ORDER — MEDROXYPROGESTERONE ACETATE 150 MG/ML IM SUSP
150.0000 mg | Freq: Once | INTRAMUSCULAR | Status: AC
  Administered 2015-08-12: 150 mg via INTRAMUSCULAR

## 2015-08-12 NOTE — Telephone Encounter (Signed)
Pt has an appointment on 08/20/2015 with Dr. Caroleen Hammanumley. [place], CMA

## 2015-08-20 ENCOUNTER — Ambulatory Visit (INDEPENDENT_AMBULATORY_CARE_PROVIDER_SITE_OTHER): Payer: BLUE CROSS/BLUE SHIELD | Admitting: Family Medicine

## 2015-08-20 ENCOUNTER — Encounter: Payer: Self-pay | Admitting: Family Medicine

## 2015-08-20 VITALS — BP 130/90 | HR 90 | Temp 98.3°F | Ht 64.0 in | Wt 201.0 lb

## 2015-08-20 DIAGNOSIS — G43119 Migraine with aura, intractable, without status migrainosus: Secondary | ICD-10-CM

## 2015-08-20 DIAGNOSIS — I10 Essential (primary) hypertension: Secondary | ICD-10-CM

## 2015-08-20 DIAGNOSIS — E119 Type 2 diabetes mellitus without complications: Secondary | ICD-10-CM

## 2015-08-20 LAB — POCT GLYCOSYLATED HEMOGLOBIN (HGB A1C): HEMOGLOBIN A1C: 6.6

## 2015-08-20 MED ORDER — GLIPIZIDE 5 MG PO TABS: 5.0000 mg | ORAL_TABLET | Freq: Two times a day (BID) | ORAL | Status: DC

## 2015-08-20 MED ORDER — AMLODIPINE BESYLATE-VALSARTAN 10-320 MG PO TABS: 1.0000 | ORAL_TABLET | Freq: Every day | ORAL | Status: DC

## 2015-08-20 MED ORDER — TRIAMCINOLONE ACETONIDE 0.025 % EX OINT: TOPICAL_OINTMENT | CUTANEOUS | Status: DC

## 2015-08-20 MED ORDER — SUMATRIPTAN SUCCINATE 50 MG PO TABS: 50.0000 mg | ORAL_TABLET | ORAL | Status: DC | PRN

## 2015-08-20 MED ORDER — METOPROLOL SUCCINATE ER 50 MG PO TB24: 50.0000 mg | ORAL_TABLET | Freq: Every day | ORAL | Status: DC

## 2015-08-20 NOTE — Patient Instructions (Addendum)
Thank you so much for coming to visit today! I have refilled your medications. I will fill out your FMLA paperwork and contact you with any questions. We have increased your blood pressure medication. Please follow up in 4 weeks for a nurses visit so we can check your blood pressure. Don't forget to get your pap smear!  Thanks again! Dr. Caroleen Hammanumley

## 2015-08-22 NOTE — Assessment & Plan Note (Signed)
-   Will fill out FMLA paperwork - Continue Imitrex - No current headache

## 2015-08-22 NOTE — Progress Notes (Signed)
Subjective:     Patient ID: Shirley MallingGail Hernandez, female   DOB: 1970/05/20, 45 y.o.   MRN: [id number]  HPI [place] Hernandez is a 45yo female presenting today for evaluation of FMLA paperwork.  # Migraines - Work requesting paperwork for migraines. States she misses work every so often for migraines in a while and they are considering firing her if she doesn't have paperwork completed. - Works at [employer], which is very noisy and makes it difficult for her to work with a migraine. - Migraines started at 45yo prior to move from [place] - Family history of migraines in her mother, father, and sister - Has been hospitalized in the past for her migraines, mostly while she lived in [place]. Trip to ED noted on 06/2015 due to migraine. - Migraines occur once or twice a month. Used to last 3 days, but now only misses one day of work due to Imitrex. States Imitrex helps significantly. Imitrex initiated in 2015. - Reports symptoms of photophobia and phonophobia with migraines - Denies current headache  # Hypertension - Also with history of hypertension, currently prescribed Amlodidpine-Valsartan 10/160mg  and Metoprolol 50mg .  - States when she takes her blood pressure at home it is usually in 150s/100s. Does not record in log.  - Denies headache (current), chest pain, blurred vision, shortness of breath  # Diabetes: - Last A1C 6.5 - Currenlty prescribed Glipizide. - Allergy to Metformin noted, stating it closes up her throat.  - Gets pap smear at Ob/Gyn. States she is due for an appointment soon and will ask for this to be done. - Smoker  Review of Systems Per HPI. Other systems negative.    Objective:   Physical Exam  Constitutional: She appears well-developed and well-nourished. No distress.  HENT:  Head: Normocephalic and atraumatic.  Cardiovascular: Normal rate.   No murmur heard. Pulmonary/Chest: Effort normal. No respiratory distress. She has no wheezes.  Neurological: No  cranial nerve deficit.  Muscle strength 5/5 in upper and lower extremities. Sensation intact.  Psychiatric: She has a normal mood and affect. Her behavior is normal.       Assessment and Plan:     Migraine - Will fill out FMLA paperwork - Continue Imitrex - No current headache  Diabetes mellitus type 2, controlled - A1C 6.6 today - Continue Glipizide. Metformin allergy noted. - Return in 6months for A1C check  Essential hypertension - Amlodipine/Valsartan dose increased to 10/320mg  - Follow up in 2 weeks for blood pressure check. Bring blood pressure log.

## 2015-08-22 NOTE — Assessment & Plan Note (Signed)
-   Amlodipine/Valsartan dose increased to 10/320mg  - Follow up in 2 weeks for blood pressure check. Bring blood pressure log.

## 2015-08-22 NOTE — Assessment & Plan Note (Signed)
-   A1C 6.6 today - Continue Glipizide. Metformin allergy noted. - Return in 6months for A1C check

## 2015-09-04 ENCOUNTER — Telehealth: Payer: Self-pay | Admitting: Family Medicine

## 2015-09-04 NOTE — Telephone Encounter (Signed)
Please let [place] know that her papework is ready for pickup at the front desk.

## 2015-09-05 NOTE — Telephone Encounter (Signed)
Pt informed. Deseree Blount, CMA  

## 2015-09-05 NOTE — Telephone Encounter (Signed)
LM for pt to call back to inform her of below. Lamonte SakaiZimmerman Rumple, Chika Cichowski D, [place]

## 2015-10-28 ENCOUNTER — Ambulatory Visit (INDEPENDENT_AMBULATORY_CARE_PROVIDER_SITE_OTHER): Payer: BLUE CROSS/BLUE SHIELD

## 2015-10-28 ENCOUNTER — Telehealth: Payer: Self-pay | Admitting: Family Medicine

## 2015-10-28 VITALS — BP 139/87 | Wt 209.0 lb

## 2015-10-28 DIAGNOSIS — Z3042 Encounter for surveillance of injectable contraceptive: Secondary | ICD-10-CM | POA: Diagnosis not present

## 2015-10-28 DIAGNOSIS — M79672 Pain in left foot: Secondary | ICD-10-CM

## 2015-10-28 MED ORDER — MEDROXYPROGESTERONE ACETATE 150 MG/ML IM SUSP
150.0000 mg | Freq: Once | INTRAMUSCULAR | Status: AC
  Administered 2015-10-28: 150 mg via INTRAMUSCULAR

## 2015-10-28 MED ORDER — MEDROXYPROGESTERONE ACETATE 150 MG/ML IM SUSP: 150.0000 mg | INTRAMUSCULAR | 0 refills | Status: DC

## 2015-10-28 NOTE — Telephone Encounter (Signed)
Please disregard previous message. Form is old and was just placed in box today, however it was addressed by Dr. Adriana Simasook in 2016. Physical therapy referral canceled.

## 2015-10-28 NOTE — Addendum Note (Signed)
Addended by: Cheree DittoGRAHAM, DEMETRICE A on: 10/28/2015 04:18 PM   Modules accepted: Orders

## 2015-10-28 NOTE — Telephone Encounter (Signed)
Received form in inbox for disability for left foot pain. I have never evaluated [place] for this and she will need an office visit to discuss the form. The form also requires information from a functional capacity evaluation by physical therapy--a referral to physical therapy has been placed.

## 2015-10-28 NOTE — Progress Notes (Signed)
Patient presented to office today for depo-provera injection. Patient tolerated well and will follow up in three months.

## 2015-12-10 DIAGNOSIS — H5203 Hypermetropia, bilateral: Secondary | ICD-10-CM | POA: Diagnosis not present

## 2016-01-13 ENCOUNTER — Ambulatory Visit (INDEPENDENT_AMBULATORY_CARE_PROVIDER_SITE_OTHER): Payer: BLUE CROSS/BLUE SHIELD

## 2016-01-13 VITALS — BP 133/99 | HR 109 | Wt 208.0 lb

## 2016-01-13 DIAGNOSIS — Z3042 Encounter for surveillance of injectable contraceptive: Secondary | ICD-10-CM | POA: Diagnosis not present

## 2016-01-13 MED ORDER — MEDROXYPROGESTERONE ACETATE 150 MG/ML IM SUSP
150.0000 mg | Freq: Once | INTRAMUSCULAR | Status: AC
  Administered 2016-01-13: 150 mg via INTRAMUSCULAR

## 2016-01-13 MED ORDER — MEDROXYPROGESTERONE ACETATE 150 MG/ML IM SUSP: 150.0000 mg | INTRAMUSCULAR | 0 refills | Status: DC

## 2016-01-13 NOTE — Progress Notes (Addendum)
Patient presented today for her depo- provera. Patient tolerated well and will follow up between 03/29/2016-04/13/2016 for next injection.

## 2016-01-13 NOTE — Addendum Note (Signed)
Addended by: Cheree DittoGRAHAM, DEMETRICE A on: 01/13/2016 04:34 PM   Modules accepted: Orders

## 2016-01-13 NOTE — Addendum Note (Signed)
Addended by: Cheree DittoGRAHAM, Gaelle Adriance A on: 01/13/2016 04:38 PM   Modules accepted: Orders

## 2016-03-30 ENCOUNTER — Ambulatory Visit: Payer: BLUE CROSS/BLUE SHIELD

## 2016-06-20 ENCOUNTER — Other Ambulatory Visit: Payer: Self-pay | Admitting: Family Medicine

## 2016-08-31 ENCOUNTER — Other Ambulatory Visit: Payer: Self-pay | Admitting: Family Medicine

## 2016-09-10 ENCOUNTER — Encounter: Payer: Self-pay | Admitting: Obstetrics and Gynecology

## 2016-09-10 ENCOUNTER — Ambulatory Visit: Payer: BLUE CROSS/BLUE SHIELD | Admitting: Obstetrics and Gynecology

## 2016-09-28 ENCOUNTER — Ambulatory Visit: Payer: Self-pay | Admitting: Obstetrics & Gynecology

## 2016-09-28 ENCOUNTER — Telehealth: Payer: Self-pay | Admitting: Lab

## 2016-09-28 NOTE — Telephone Encounter (Signed)
Call patient to tell her that she missed her appointment today at 3:00pm (Per Dr.Arnold). Patient stated she forgot and will call in the morning to reschedule her appoinment

## 2016-10-07 ENCOUNTER — Ambulatory Visit: Payer: Self-pay | Admitting: Obstetrics & Gynecology

## 2016-10-15 ENCOUNTER — Ambulatory Visit (INDEPENDENT_AMBULATORY_CARE_PROVIDER_SITE_OTHER): Payer: BLUE CROSS/BLUE SHIELD | Admitting: Family Medicine

## 2016-10-15 ENCOUNTER — Encounter: Payer: Self-pay | Admitting: Family Medicine

## 2016-10-15 VITALS — BP 144/100 | HR 102 | Temp 98.4°F | Ht 64.0 in | Wt 205.6 lb

## 2016-10-15 DIAGNOSIS — M549 Dorsalgia, unspecified: Secondary | ICD-10-CM

## 2016-10-15 DIAGNOSIS — E119 Type 2 diabetes mellitus without complications: Secondary | ICD-10-CM | POA: Diagnosis not present

## 2016-10-15 LAB — POCT GLYCOSYLATED HEMOGLOBIN (HGB A1C): HEMOGLOBIN A1C: 6.9

## 2016-10-15 MED ORDER — CYCLOBENZAPRINE HCL 10 MG PO TABS: 10.0000 mg | ORAL_TABLET | Freq: Three times a day (TID) | ORAL | 0 refills | Status: DC | PRN

## 2016-10-15 MED ORDER — IBUPROFEN 800 MG PO TABS: 800.0000 mg | ORAL_TABLET | Freq: Three times a day (TID) | ORAL | 0 refills | Status: DC | PRN

## 2016-10-15 NOTE — Patient Instructions (Signed)
It was a pleasure to see you today! Thank you for choosing [facility] for your primary care. [place] was seen for back pain.   Our plans for today were:  For your back pain - try a muscle rub cream like icy hot  Try flexeril at bedtime for the next 3-4 days to loosen the muscle spasm.   You can take ibuprofen as needed for the next several days. Try not to take this too much as it can worsen your migraines.    You should return to our clinic to see [place] Hernandez in 1 month for DM.   Best,  Dr. Chanetta Marshallimberlake

## 2016-10-15 NOTE — Progress Notes (Signed)
   CC: back pain  HPI Back Pain: Patient presents for presents evaluation of low back problems.  Symptoms have been present for 2 weeks and include pain in lumbar muscles bilaterally (aching and dull in character; 6/10 in severity). Initial inciting event: none. Alleviating factors identifiable by patient are medication Aleeve and having BM. 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 Treatments so far initiated by patient: Aleeve Previous lower back problems: none. Previous workup: none. Previous treatments: none. Denies weight loss, incontinence, radiation, weakness.   Has been taking her BP meds, attributes elevation today to pain.   CC, SH/smoking status, and VS noted  Objective: BP (!) 144/100   Pulse (!) 102   Temp 98.4 F (36.9 C) (Oral)   Ht 5\' 4"  (1.626 m)   Wt 205 lb 9.6 oz (93.3 kg)   SpO2 98%   BMI 35.29 kg/m  Gen: NAD, alert, cooperative, and pleasant obese female.  HEENT: NCAT, EOMI, PERRL CV: RRR, no murmur Resp: CTAB, no wheezes, non-labored Abd: SNTND, BS present, no guarding or organomegaly Ext: No edema, warm MSK: no stepoffs or tenderness over entire spine, TTP over bilateral paraspinal muscles and through lumbar musculature. Sensation intact.  Neuro: Alert and oriented, Speech clear, No gross deficits  Assessment and plan:  Musculoskeletal back pain New for patient. Has tried OTC NSAIDs without significant improvement. Exam consistent with musculoskeletal strain. No red flags. Discussed that abdominal obesity is worsening the strain on her back muscles. Recommended follow up with PCP for DM and chronic problems. RTC if worsening. Ibuprofen 800mg  for a short course due to patient's underlying migraines, which could be worsened by chronic NSAID use. Also short course of flexeril QHS for muscle spasm.    Orders Placed This Encounter  Procedures  . HgB A1c    Meds ordered this encounter    Medications  . ibuprofen (ADVIL,MOTRIN) 800 MG tablet    Sig: Take 1 tablet (800 mg total) by mouth every 8 (eight) hours as needed.    Dispense:  20 tablet    Refill:  0  . cyclobenzaprine (FLEXERIL) 10 MG tablet    Sig: Take 1 tablet (10 mg total) by mouth 3 (three) times daily as needed for muscle spasms.    Dispense:  10 tablet    Refill:  0    Loni MuseKate Laurier Jasperson, MD, PGY2 10/16/2016 8:20 AM

## 2016-10-16 DIAGNOSIS — M545 Low back pain, unspecified: Secondary | ICD-10-CM | POA: Insufficient documentation

## 2016-10-16 NOTE — Assessment & Plan Note (Signed)
New for patient. Has tried OTC NSAIDs without significant improvement. Exam consistent with musculoskeletal strain. No red flags. Discussed that abdominal obesity is worsening the strain on her back muscles. Recommended follow up with PCP for DM and chronic problems. RTC if worsening. Ibuprofen 800mg  for a short course due to patient's underlying migraines, which could be worsened by chronic NSAID use. Also short course of flexeril QHS for muscle spasm.

## 2016-11-19 ENCOUNTER — Encounter: Payer: Self-pay | Admitting: Student in an Organized Health Care Education/Training Program

## 2016-11-19 ENCOUNTER — Ambulatory Visit (INDEPENDENT_AMBULATORY_CARE_PROVIDER_SITE_OTHER): Payer: BLUE CROSS/BLUE SHIELD | Admitting: Student in an Organized Health Care Education/Training Program

## 2016-11-19 DIAGNOSIS — M549 Dorsalgia, unspecified: Secondary | ICD-10-CM | POA: Diagnosis not present

## 2016-11-19 MED ORDER — IBUPROFEN 800 MG PO TABS: 800.0000 mg | ORAL_TABLET | Freq: Three times a day (TID) | ORAL | 0 refills | Status: DC | PRN

## 2016-11-19 NOTE — Assessment & Plan Note (Addendum)
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 No midline back pain, no pain over spinous processes, no neurologic symptoms, no fevers or IVDA. - continued ibuprofen - DC flexeril (did not help her) - no red flags - encourage continued activity - return precautions discussed

## 2016-11-19 NOTE — Progress Notes (Signed)
   CC: Back Pain  HPI: Fenton MallingGail Hernandez is a 46 y.o. female with PMH significant for T2DM, HTN, migraine, HDL who [facility] today with back pain of 3-4 weeks duration duration.   Back Pain - patient endorses this has been going on for about 3-4 weeks. Pain is worst in lumbar back/flank region - was taking aleve, was switched to ibuprofen and flexeril on 7/26. Feels flexeril did not help. - sometimes in the morning, worst with bowel movement - sometimes constant, sometimes intermittent - no numbness, tingling or weakness - no fevers - No IV drug use - hurts more with flexing forward than standing straight up - no urinary incontinence or fecal incontinence, no saddle anesthesia  No N/V/D/C, +cold symptoms over past couple of days  Review of Symptoms:  See HPI for ROS.   CC, SH/smoking status, and VS noted.  Objective: BP 114/90   Pulse 98   Temp 98.4 F (36.9 C) (Oral)   Ht 5\' 4"  (1.626 m)   Wt 204 lb (92.5 kg)   SpO2 97%   BMI 35.02 kg/m  GEN: NAD, alert, cooperative, and pleasant. Back Exam:  Inspection: Unremarkable  Palpable tenderness: None. Range of Motion:  Flexion 45 deg; Extension 45 deg; Side Bending to 45 deg bilaterally; Rotation to 45 deg bilaterally  Leg strength: Quad: 5/5 Hamstring: 5/5 Hip flexor: 5/5 Hip abductors: 5/5  Strength at foot: Plantar-flexion: 5/5 Dorsi-flexion: 5/5 Eversion: 5/5 Inversion: 5/5  Sensory change: Gross sensation intact to all lumbar and sacral dermatomes.  Reflexes: 2+ at both patellar tendons Gait unremarkable. SLR laying: Negative  XSLR laying: Negative  FABER: negative.  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 No midline back pain, no pain over spinous processes, no neurologic  symptoms, no fevers or IVDA. - continued ibuprofen - DC flexeril (did not help her) - no red flags - encourage continued activity - return precautions discussed   Meds ordered this encounter  Medications  . ibuprofen (ADVIL,MOTRIN) 800 MG tablet    Sig: Take 1 tablet (800 mg total) by mouth every 8 (eight) hours as needed.    Dispense:  20 tablet    Refill:  0    Howard PouchLauren Joselle Deeds, MD,MS,  PGY2 11/19/2016 5:36 PM

## 2016-11-19 NOTE — Patient Instructions (Addendum)
It was a pleasure seeing you today in our clinic. Today we discussed back pain. Here is the treatment plan we have discussed and agreed upon together:  Your back pain is most likely muscular in nature. You can continue using ibuprofen, or use Aleve, do not use these medications together. Unfortunately we expect back pain to take a long time to improve. I would like to reassure you that you did not have any concerning signs on exam that make me think you need imaging or to be sent to the hospital.  If you develop numbness in your legs, weakness, or fevers these would be signs they need to be evaluated immediately.   For the feeling in her head, I do not think that you need imaging of your head.  Please come in for your next A1c check at the end of October, or sooner if your symptoms worsen or fail to resolve.  Our clinic's number is [phone]. Please call with questions or concerns about what we discussed today.  Be well, Dr. Mosetta PuttFeng

## 2016-12-11 ENCOUNTER — Other Ambulatory Visit: Payer: Self-pay | Admitting: Student in an Organized Health Care Education/Training Program

## 2016-12-11 ENCOUNTER — Telehealth: Payer: Self-pay | Admitting: *Deleted

## 2016-12-11 DIAGNOSIS — E119 Type 2 diabetes mellitus without complications: Secondary | ICD-10-CM | POA: Diagnosis not present

## 2016-12-11 DIAGNOSIS — H5203 Hypermetropia, bilateral: Secondary | ICD-10-CM | POA: Diagnosis not present

## 2016-12-11 DIAGNOSIS — I10 Essential (primary) hypertension: Secondary | ICD-10-CM

## 2016-12-11 LAB — HM DIABETES EYE EXAM

## 2016-12-11 MED ORDER — VALSARTAN 320 MG PO TABS: 320.0000 mg | ORAL_TABLET | Freq: Every day | ORAL | 0 refills | Status: DC

## 2016-12-11 MED ORDER — AMLODIPINE BESYLATE 10 MG PO TABS: 10.0000 mg | ORAL_TABLET | Freq: Every day | ORAL | 11 refills | Status: DC

## 2016-12-11 NOTE — Progress Notes (Signed)
Faxed amlodipine and valsartan separately to pharmacy as patient states her combo pill was recalled. Kept doses the same as previously dosed combo pill.

## 2016-12-11 NOTE — Telephone Encounter (Signed)
Patient called stating her pharmacy told her that Exforge has been recalled.  Her blood pressure is high 175/117, headache x 3 days. She also reported being out of medication for 3 days.  Patient was going to ED.  Advised patient that refill request will be sent to her provider and that they have up to 48 hours to complete the request. Patient is requesting that something else be sent to pharmacy.  Clovis Pu, RN

## 2016-12-11 NOTE — Telephone Encounter (Signed)
Received call that patient's BP is 195/117 at home with headaches. Per phone note patient is going to ED. It is appropriate for patient to go to urgent care, ED, or call for SDA. She states her amlodipine/valsartan combo pill was recalled. I will call in these medications separately, however I do think it is appropriate for patient to be seen and evaluated by a provider.  I called and LVM for patient relaying that BP meds were faxed to her pharmacy separate rather than as combo pill, and that she should be seen and evaluated based on the information in the previous phone note.

## 2017-02-01 ENCOUNTER — Encounter: Payer: Self-pay | Admitting: Student in an Organized Health Care Education/Training Program

## 2017-06-16 ENCOUNTER — Ambulatory Visit (INDEPENDENT_AMBULATORY_CARE_PROVIDER_SITE_OTHER): Payer: BLUE CROSS/BLUE SHIELD | Admitting: Family Medicine

## 2017-06-16 ENCOUNTER — Other Ambulatory Visit: Payer: Self-pay

## 2017-06-16 ENCOUNTER — Encounter: Payer: Self-pay | Admitting: Family Medicine

## 2017-06-16 DIAGNOSIS — E119 Type 2 diabetes mellitus without complications: Secondary | ICD-10-CM | POA: Diagnosis not present

## 2017-06-16 DIAGNOSIS — M545 Low back pain: Secondary | ICD-10-CM | POA: Diagnosis not present

## 2017-06-16 DIAGNOSIS — G8929 Other chronic pain: Secondary | ICD-10-CM | POA: Diagnosis not present

## 2017-06-16 DIAGNOSIS — M549 Dorsalgia, unspecified: Secondary | ICD-10-CM

## 2017-06-16 MED ORDER — CYCLOBENZAPRINE HCL 10 MG PO TABS: 10.0000 mg | ORAL_TABLET | Freq: Every day | ORAL | 1 refills | Status: DC

## 2017-06-16 MED ORDER — IBUPROFEN 800 MG PO TABS: 800.0000 mg | ORAL_TABLET | Freq: Two times a day (BID) | ORAL | 2 refills | Status: DC | PRN

## 2017-06-16 NOTE — Patient Instructions (Signed)
I will call the [phone] home number with x ray results. I sent in two prescriptions to help your back. See Dr. Mosetta PuttFeng soon about the diabetes. We talk more on the phone.

## 2017-06-17 ENCOUNTER — Ambulatory Visit
Admission: RE | Admit: 2017-06-17 | Discharge: 2017-06-17 | Disposition: A | Discharge status: Home / Self Care | Payer: BLUE CROSS/BLUE SHIELD | Source: Ambulatory Visit | Attending: Family Medicine | Admitting: Family Medicine

## 2017-06-17 DIAGNOSIS — M549 Dorsalgia, unspecified: Secondary | ICD-10-CM

## 2017-06-17 DIAGNOSIS — M47817 Spondylosis without myelopathy or radiculopathy, lumbosacral region: Secondary | ICD-10-CM | POA: Diagnosis not present

## 2017-06-18 ENCOUNTER — Encounter: Payer: Self-pay | Admitting: Family Medicine

## 2017-06-18 NOTE — Assessment & Plan Note (Signed)
NEEDS FU with PCP

## 2017-06-18 NOTE — Progress Notes (Signed)
   Subjective:    Patient ID: Shirley MallingGail Denham, female    DOB: 1970/07/21, 47 y.o.   MRN: [id number]  HPI SDA visit for low back pain that is currently severe but mostly chronic in nature.  Her back has been hurting off and on since Aug 2018 with an acute injury - but in retrospect has been hurting even longer than that.  She feels the pain mostly in the right lower back.  Interestingly, she states she has pain in the left (contralateral) leg.  She then tells me she has had very longstanding left leg problems dating back years.  No incontinence of bowel or bladder.  No saddle anesthesia.  No personal history of cancer and not immunocompromised.    Review of Systems     Objective:   Physical Exam Rt sided lumbar paraspinous muscle tenderness.  Normal strength, sensation and reflexes (ankle and patellar) bilaterally.  Normal gate.        Assessment & Plan:

## 2017-06-18 NOTE — Assessment & Plan Note (Signed)
Ibuprofen and muscle relaxer.  Called and given x ray results.  She is feeling better on meds.   Strongly encourage to FU with PCP.  She knows she is behind on health maint.and DM care.

## 2017-07-02 ENCOUNTER — Other Ambulatory Visit: Payer: Self-pay

## 2017-07-03 MED ORDER — GLIPIZIDE 5 MG PO TABS: ORAL_TABLET | ORAL | 3 refills | Status: DC

## 2017-07-06 ENCOUNTER — Ambulatory Visit (INDEPENDENT_AMBULATORY_CARE_PROVIDER_SITE_OTHER): Payer: BLUE CROSS/BLUE SHIELD | Admitting: Student in an Organized Health Care Education/Training Program

## 2017-07-06 ENCOUNTER — Other Ambulatory Visit: Payer: Self-pay

## 2017-07-06 ENCOUNTER — Encounter: Payer: Self-pay | Admitting: Student in an Organized Health Care Education/Training Program

## 2017-07-06 VITALS — BP 134/84 | HR 84 | Temp 98.0°F | Ht 64.0 in | Wt 218.0 lb

## 2017-07-06 DIAGNOSIS — E118 Type 2 diabetes mellitus with unspecified complications: Secondary | ICD-10-CM

## 2017-07-06 DIAGNOSIS — G8929 Other chronic pain: Secondary | ICD-10-CM

## 2017-07-06 DIAGNOSIS — M545 Low back pain: Secondary | ICD-10-CM | POA: Diagnosis not present

## 2017-07-06 LAB — POCT GLYCOSYLATED HEMOGLOBIN (HGB A1C): Hemoglobin A1C: 8.3

## 2017-07-06 NOTE — Patient Instructions (Signed)
It was a pleasure seeing you today in our clinic. Today we discussed back pain and diabetes. Here is the treatment plan we have discussed and agreed upon together:   Diabetes Your diabetes is not well controlled. Your A1c is 8.3. Your goal is to have an A1c < 6.5%  Please schedule a visit with Dr. Raymondo BandKoval our pharmacist as you leave the office today.  For your back pain, please continue ibuprofen, stretches, tylenol as needed. It may take several weeks before this acute flare resolves. Your back Xray showed mild degenerative changes but no further workup is required at this time.  Our clinic's number is [phone]. Please call with questions or concerns about what we discussed today.  Be well, Dr. Mosetta PuttFeng

## 2017-07-06 NOTE — Progress Notes (Signed)
Subjective:    Shirley MallingGail Fusaro - 47 y.o. female MRN [id number]  Date of birth: 10-Aug-1970  HPI  Shirley Hernandez is here for  Back pain and diabetes   BACK PAIN - was recently seen by Dr. Leveda AnnaHensel for back pain. Right-sided back pain. Denies pain shooting down her leg. She has been taking motrin and a muscle relaxer. Works at [employer] and does bending and lifting at work.  No weakness, no numbness or tingling. No numbness in groin, no loss of urine. No IVDA. No fevers.   DIABETES Feels has gained weight and eating worse over last 6 months. Work is stressful for her. She is motivated to go to the gym and start exercising, plans on getting a gym membership. She endorses interest in changing her diet. Disease Monitoring: HbA1c worsened at 8.3 from 6.9 when tested 6 months ago. Blood Sugar ranges-100-160 on AM check  Polyuria/phagia/dipsia- +polyuria with some nocturia, occasional       Visual problems- none Medications: Glipizide 5 mg BID Compliance- reports frequent missed doses, feels that sometimes her sugars go low with "shakiness" but has not checked sugar   Monitoring Labs and Parameters Last A1C:  Lab Results  Component Value Date   HGBA1C 8.3 07/06/2017    Last Lipid:     Component Value Date/Time   CHOL 190 07/28/2011 0831   HDL 32 (L) 07/28/2011 0831   LDLDIRECT 113 (H) 09/20/2013 0912    Last Bmet  Potassium  Date Value Ref Range Status  08/22/2012 3.8 3.5 - 5.3 mEq/L Final   Sodium  Date Value Ref Range Status  08/22/2012 138 135 - 145 mEq/L Final   Creat  Date Value Ref Range Status  08/22/2012 0.88 0.50 - 1.10 mg/dL Final      Last BPs:  BP Readings from Last 3 Encounters:  07/06/17 134/84  06/16/17 128/76  11/19/16 114/90   Health Maintenance:  Health Maintenance Due  Topic Date Due  . PNEUMOCOCCAL POLYSACCHARIDE VACCINE (1) 01/03/1971  . FOOT EXAM  01/03/1979  . HIV Screening  01/03/1984  . PAP SMEAR  10/14/2011  . TETANUS/TDAP  10/13/2012  .  HEMOGLOBIN A1C  04/17/2017   -  reports that she has been smoking cigarettes.  She has a 12.00 pack-year smoking history. She has never used smokeless tobacco. - Review of Systems: Per HPI. - Past Medical History: Patient Active Problem List   Diagnosis Date Noted  . Low back pain without sciatica 10/16/2016  . Injury of left foot 09/11/2014  . Headache(784.0) 04/28/2013  . Hyperlipidemia LDL goal < 100 07/29/2011  . Diabetes mellitus type 2, controlled (HCC) 07/21/2011  . Obesity 07/21/2011  . Menorrhagia 07/08/2011  . Eczema 10/14/2010  . Migraine 05/14/2010  . Family history of diabetes mellitus 05/14/2010  . Essential hypertension 04/17/2009   - Medications: reviewed and updated Current Outpatient Medications  Medication Sig Dispense Refill  . acetaminophen (TYLENOL) 500 MG tablet Take 1,000 mg by mouth every 6 (six) hours as needed for mild pain.     Marland Kitchen. amLODipine (NORVASC) 10 MG tablet Take 1 tablet (10 mg total) by mouth daily. 30 tablet 11  . amLODipine-valsartan (EXFORGE) 10-320 MG tablet TAKE ONE TABLET BY MOUTH ONCE DAILY 30 tablet 11  . cyclobenzaprine (FLEXERIL) 10 MG tablet Take 1 tablet (10 mg total) by mouth at bedtime. 30 tablet 1  . glipiZIDE (GLUCOTROL) 5 MG tablet TAKE ONE TABLET BY MOUTH TWICE DAILY BEFORE  A  MEAL. 60 tablet 3  .  ibuprofen (ADVIL,MOTRIN) 800 MG tablet Take 1 tablet (800 mg total) by mouth 2 (two) times daily as needed. 40 tablet 2  . medroxyPROGESTERone (DEPO-PROVERA) 150 MG/ML injection Inject 1 mL (150 mg total) into the muscle every 3 (three) months. 1 mL 0  . medroxyPROGESTERone (DEPO-PROVERA) 150 MG/ML injection Inject 1 mL (150 mg total) into the muscle every 3 (three) months. 1 mL 0  . metoprolol succinate (TOPROL-XL) 50 MG 24 hr tablet TAKE ONE TABLET BY MOUTH ONCE DAILY 90 tablet 3  . SUMAtriptan (IMITREX) 50 MG tablet TAKE ONE TABLET BY MOUTH EVERY 2 HOURS AS NEEDED FOR MIGRAINE. MAY REPEAT IN 2 HOURS IF HEADACHE PERSISTS OR RECURS. 9  tablet 3  . triamcinolone (KENALOG) 0.025 % ointment APPLY TO AFFECTED AREAS DAILY AS NEEDED 15 g 3  . valsartan (DIOVAN) 320 MG tablet Take 1 tablet (320 mg total) by mouth daily. 30 tablet 0   No current facility-administered medications for this visit.     Review of Systems See HPI     Objective:   Physical Exam BP 134/84   Pulse 84   Temp 98 F (36.7 C)   Ht 5\' 4"  (1.626 m)   Wt 218 lb (98.9 kg)   SpO2 99%   BMI 37.42 kg/m  Gen: NAD, alert, cooperative with exam, well-appearing  CV: RRR, good S1/S2, no murmur, no edema, capillary refill brisk  Resp: CTABL, no wheezes, non-labored Abd: SNTND, BS present, no guarding or organomegaly Back Exam:  Inspection: Unremarkable  Palpable tenderness: +mild ttp over right lower lumbar region Range of Motion:  Flexion 45 deg; Extension 45 deg; Side Bending to 45 deg bilaterally; Rotation to 45 deg bilaterally  Leg strength: Quad: 5/5 Hamstring: 5/5 Hip flexor: 5/5 Hip abductors: 5/5  Strength at foot: Plantar-flexion: 5/5 Dorsi-flexion: 5/5 Eversion: 5/5 Inversion: 5/5  Sensory change: Gross sensation intact to all lumbar and sacral dermatomes.  Reflexes: 2+ at both patellar tendons Gait unremarkable.   Back XR 06/17/2017 FINDINGS: Trace anterolisthesis of L4 on L5. Mild degenerative changes at L4-L5 and mild to moderate degenerative changes at L5-S1. Remaining disc spaces are normal. Vertebral body heights are normal. Mild facet degenerative changes at the lumbosacral spine.  IMPRESSION: 1. No acute osseous abnormality 2. Degenerative changes at L4-L5 and L5-S1 with trace anterolisthesis of L4 on L5.     Assessment & Plan:   Diabetes mellitus type 2, controlled A1c worsened. Encouraged patient not to miss doses of glipizide. Encouraged her to check blood sugars when she feels "shaky." She has had severe allergic reaction to metformin so this is not an option for her.  - discussed lifestyle changes including pool exercise  which may be more tolerable with back pain - continue glipizide, encouraged not to miss doses - amb referral to nutrition (Dr. Gerilyn Pilgrim, card provided) - patient to schedule follow up with Dr. Raymondo Band, likely needs insulin started   Low back pain without sciatica History and physical most consistent with MSK back pain. Reviewed back XR from 3/28 which showed mild- mod degenerative changes, discussed with patient. No red flags on exam or history. - continue PRN ibuprofen, tylenol - encourage exercise, pool exercise may be helpful - discussed stretching/rolling out back with a tennis ball   Orders Placed This Encounter  Procedures  . Ambulatory referral to diabetic education    Referral Priority:   Routine    Referral Type:   Consultation    Referral Reason:   Specialty Services Required  Number of Visits Requested:   1  . HgB A1c    Howard Pouch, MD,MS,  PGY2 07/06/2017 5:11 PM

## 2017-07-06 NOTE — Assessment & Plan Note (Signed)
A1c worsened. Encouraged patient not to miss doses of glipizide. Encouraged her to check blood sugars when she feels "shaky." She has had severe allergic reaction to metformin so this is not an option for her.  - discussed lifestyle changes including pool exercise which may be more tolerable with back pain - continue glipizide, encouraged not to miss doses - amb referral to nutrition (Dr. Gerilyn PilgrimSykes, card provided) - patient to schedule follow up with Dr. Raymondo BandKoval, likely needs insulin started

## 2017-07-06 NOTE — Assessment & Plan Note (Signed)
History and physical most consistent with MSK back pain. Reviewed back XR from 3/28 which showed mild- mod degenerative changes, discussed with patient. No red flags on exam or history. - continue PRN ibuprofen, tylenol - encourage exercise, pool exercise may be helpful - discussed stretching/rolling out back with a tennis ball

## 2017-07-22 ENCOUNTER — Ambulatory Visit: Payer: BLUE CROSS/BLUE SHIELD | Admitting: Pharmacist

## 2017-07-27 ENCOUNTER — Ambulatory Visit: Payer: BLUE CROSS/BLUE SHIELD | Admitting: *Deleted

## 2017-08-03 ENCOUNTER — Other Ambulatory Visit: Payer: Self-pay

## 2017-08-11 ENCOUNTER — Other Ambulatory Visit: Payer: Self-pay

## 2017-08-12 MED ORDER — TRIAMCINOLONE ACETONIDE 0.025 % EX OINT: TOPICAL_OINTMENT | CUTANEOUS | 0 refills | Status: DC

## 2017-11-30 ENCOUNTER — Telehealth: Payer: Self-pay | Admitting: Student in an Organized Health Care Education/Training Program

## 2017-11-30 NOTE — Telephone Encounter (Signed)
LVM for patient to contact [facility]. Left office contact number. Kg.

## 2017-11-30 NOTE — Telephone Encounter (Signed)
Called patient on mobile. Mobile is not in service. kg

## 2017-12-23 ENCOUNTER — Ambulatory Visit: Payer: Self-pay | Admitting: Family Medicine

## 2018-01-04 ENCOUNTER — Telehealth: Payer: Self-pay | Admitting: Student in an Organized Health Care Education/Training Program

## 2018-01-04 NOTE — Telephone Encounter (Signed)
Called pt, error, had appt already scheduled.

## 2018-01-06 ENCOUNTER — Other Ambulatory Visit: Payer: Self-pay

## 2018-01-06 ENCOUNTER — Encounter: Payer: Self-pay | Admitting: Family Medicine

## 2018-01-06 ENCOUNTER — Ambulatory Visit (INDEPENDENT_AMBULATORY_CARE_PROVIDER_SITE_OTHER): Payer: PRIVATE HEALTH INSURANCE | Admitting: Family Medicine

## 2018-01-06 VITALS — BP 140/98 | HR 104 | Temp 98.0°F | Ht 64.0 in | Wt 207.6 lb

## 2018-01-06 DIAGNOSIS — G43119 Migraine with aura, intractable, without status migrainosus: Secondary | ICD-10-CM

## 2018-01-06 DIAGNOSIS — G5603 Carpal tunnel syndrome, bilateral upper limbs: Secondary | ICD-10-CM

## 2018-01-06 DIAGNOSIS — G5600 Carpal tunnel syndrome, unspecified upper limb: Secondary | ICD-10-CM | POA: Insufficient documentation

## 2018-01-06 DIAGNOSIS — L309 Dermatitis, unspecified: Secondary | ICD-10-CM

## 2018-01-06 DIAGNOSIS — E119 Type 2 diabetes mellitus without complications: Secondary | ICD-10-CM

## 2018-01-06 LAB — POCT GLYCOSYLATED HEMOGLOBIN (HGB A1C): HbA1c, POC (controlled diabetic range): 7 % (ref 0.0–7.0)

## 2018-01-06 LAB — POCT SEDIMENTATION RATE: POCT SED RATE: 9 mm/h (ref 0–22)

## 2018-01-06 MED ORDER — TRIAMCINOLONE ACETONIDE 0.025 % EX OINT: TOPICAL_OINTMENT | CUTANEOUS | 2 refills | Status: DC

## 2018-01-06 MED ORDER — IBUPROFEN 600 MG PO TABS: 600.0000 mg | ORAL_TABLET | Freq: Three times a day (TID) | ORAL | 6 refills | Status: DC | PRN

## 2018-01-06 MED ORDER — SUMATRIPTAN SUCCINATE 50 MG PO TABS: ORAL_TABLET | ORAL | 3 refills | Status: DC

## 2018-01-06 NOTE — Progress Notes (Signed)
   Subjective:    Patient ID: Shirley Hernandez, female    DOB: Apr 25, 1970, 47 y.o.   MRN: [id number]  HPI Bilateral hand and wrist pain, Left >right.  Patient began work at a [employer] 09/17/17.  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  Shortly after being transferred, she began having bilateral wrist pain and swelling.  Also experiences tingling of fingers both hands left>right.  No previous wrist problems.  Seen by work Engineer, civil (consulting) without benefit.  Also DM.  Has cut back on sodas.  A1C much improved.  Behind on HPDP.  Now has insurance and plans to catch up.    Need refill on migraine meds and eczema.  Hand eczema is flaired with wearing of gloves.      Review of Systems     Objective:   Physical Exam Bilateral positive carpal tunnel testing.  Left produced more hand tingling than right.  Right hand trouble fully flexing 4th digit.  Also some diffuse pain at wrist.  No other joint abnormalities.       Assessment & Plan:

## 2018-01-06 NOTE — Assessment & Plan Note (Signed)
Congratulated on improvement.  FU Dr. Mosetta Putt for her HPDP activities.

## 2018-01-06 NOTE — Patient Instructions (Addendum)
I will call with the results of your blood work.   I want you to see sports medicine for possible splinting of your wrists and maybe a steroid injection. You need to make an appointment with your regular doctor, Dr. Mosetta Putt, for a pap smear and to get caught up on other testing to keep you healthy. I think it is OK to go back to your old job.  I would not recommend returning to your current job until seen by sports medicine.   You have carpal tunnel syndrome which is caused by repetitive motion.

## 2018-01-06 NOTE — Assessment & Plan Note (Signed)
At least carpal tunnel, maybe more.  Exam suggests coexisting tendonitis.  Will refer to [facility] for ultrasound eval and possible steroid injection.

## 2018-01-06 NOTE — Assessment & Plan Note (Signed)
Refilled imitrex. FU Dr. Mosetta Putt

## 2018-01-07 ENCOUNTER — Encounter: Payer: Self-pay | Admitting: Family Medicine

## 2018-01-07 LAB — RHEUMATOID FACTOR: Rhuematoid fact SerPl-aCnc: <10 IU/mL (ref 0.0–13.9)

## 2018-01-07 LAB — TSH: TSH: 0.539 u[IU]/mL (ref 0.450–4.500)

## 2018-01-12 ENCOUNTER — Encounter: Payer: Self-pay | Admitting: Family Medicine

## 2018-01-12 ENCOUNTER — Ambulatory Visit: Payer: PRIVATE HEALTH INSURANCE | Admitting: Family Medicine

## 2018-01-12 ENCOUNTER — Ambulatory Visit (INDEPENDENT_AMBULATORY_CARE_PROVIDER_SITE_OTHER): Payer: PRIVATE HEALTH INSURANCE | Admitting: Family Medicine

## 2018-01-12 VITALS — BP 150/100 | Ht 65.0 in | Wt 207.0 lb

## 2018-01-12 DIAGNOSIS — G5603 Carpal tunnel syndrome, bilateral upper limbs: Secondary | ICD-10-CM

## 2018-01-12 DIAGNOSIS — M659 Synovitis and tenosynovitis, unspecified: Secondary | ICD-10-CM | POA: Diagnosis not present

## 2018-01-12 MED ORDER — PREDNISONE 10 MG PO TABS: ORAL_TABLET | ORAL | 0 refills | Status: DC

## 2018-01-12 NOTE — Patient Instructions (Signed)
You have carpal tunnel syndrome and a flexor tenosynovitis of your right ring finger. Wear the wrist brace at nighttime and as often as possible during the day Prednisone dose pack x 6 days. Day AFTER finishing prednisone you can take aleve 1-2 tabs twice a day with food if needed. Corticosteroid injection is a consideration to help with pain and inflammation If not improving, will consider nerve conduction studies to assess severity. Follow up with me in 1 month.

## 2018-01-13 ENCOUNTER — Encounter: Payer: Self-pay | Admitting: Family Medicine

## 2018-01-13 NOTE — Progress Notes (Addendum)
PCP: Howard Pouch, MD Consultation requested by Dr. Leveda Anna  Subjective:   HPI: Patient is a 47 y.o. female here for bilateral wrist/hand pain, right ring finger pain.  Patient reports she started to get pain in both wrists and hands on volar side dating to June when working at a [employer]. Her job requires cutting chicken repetitively. She states right side started first but both are about the same now. Pain is sharp, worse with sleep and at work. She saw work Engineer, civil (consulting) and was only given ice pack, told to use ibuprofen and use biofreeze. Associated numbness into middle 3 digits of both hands. Associated swelling as well. She reports recent difficulty fully flexing her right ring finger also. No skin changes.  Past Medical History:  Diagnosis Date  . Anxiety   . Depression   . Diabetes mellitus   . Headache(784.0)   . Hypertension     Current Outpatient Medications on File Prior to Visit  Medication Sig Dispense Refill  . acetaminophen (TYLENOL) 500 MG tablet Take 1,000 mg by mouth every 6 (six) hours as needed for mild pain.     Marland Kitchen amLODipine-valsartan (EXFORGE) 10-320 MG tablet TAKE ONE TABLET BY MOUTH ONCE DAILY 30 tablet 11  . glipiZIDE (GLUCOTROL) 5 MG tablet TAKE ONE TABLET BY MOUTH TWICE DAILY BEFORE  A  MEAL. 60 tablet 3  . ibuprofen (ADVIL,MOTRIN) 600 MG tablet Take 1 tablet (600 mg total) by mouth every 8 (eight) hours as needed. Take with food. 100 tablet 6  . medroxyPROGESTERone (DEPO-PROVERA) 150 MG/ML injection Inject 1 mL (150 mg total) into the muscle every 3 (three) months. 1 mL 0  . metoprolol succinate (TOPROL-XL) 50 MG 24 hr tablet TAKE ONE TABLET BY MOUTH ONCE DAILY 90 tablet 3  . SUMAtriptan (IMITREX) 50 MG tablet TAKE ONE TABLET BY MOUTH EVERY 2 HOURS AS NEEDED FOR MIGRAINE. MAY REPEAT IN 2 HOURS IF HEADACHE PERSISTS OR RECURS. 9 tablet 3  . triamcinolone (KENALOG) 0.025 % ointment APPLY TO AFFECTED AREAS DAILY AS NEEDED 80 g 2   No current  facility-administered medications on file prior to visit.     Past Surgical History:  Procedure Laterality Date  . DILATION AND CURETTAGE OF UTERUS    . DILATION AND CURETTAGE OF UTERUS  03/23/2011   Procedure: DILATATION AND CURETTAGE;  Surgeon: Myra C. Marice Potter, MD;  Location: [facility];  Service: Gynecology;  Laterality: N/A;    Allergies  Allergen Reactions  . Metformin And Related Anaphylaxis    Throat swelling    Social History   Socioeconomic History  . Marital status: Single    Spouse name: Not on file  . Number of children: Not on file  . Years of education: Not on file  . Highest education level: Not on file  Occupational History  . Not on file  Social Needs  . Financial resource strain: Not on file  . Food insecurity:    Worry: Not on file    Inability: Not on file  . Transportation needs:    Medical: Not on file    Non-medical: Not on file  Tobacco Use  . Smoking status: Current Some Day Smoker    Packs/day: 1.00    Years: 12.00    Pack years: 12.00    Types: Cigarettes  . Smokeless tobacco: Never Used  Substance and Sexual Activity  . Alcohol use: Yes    Alcohol/week: 0.0 standard drinks    Comment: socially  . Drug use: No  .  Sexual activity: Yes    Birth control/protection: Injection    Comment: wants depo  Lifestyle  . Physical activity:    Days per week: Not on file    Minutes per session: Not on file  . Stress: Not on file  Relationships  . Social connections:    Talks on phone: Not on file    Gets together: Not on file    Attends religious service: Not on file    Active member of club or organization: Not on file    Attends meetings of clubs or organizations: Not on file    Relationship status: Not on file  . Intimate partner violence:    Fear of current or ex partner: Not on file    Emotionally abused: Not on file    Physically abused: Not on file    Forced sexual activity: Not on file  Other Topics Concern  . Not on file  Social  History Narrative  . Not on file    Family History  Problem Relation Age of Onset  . Diabetes Mother   . Hypertension Mother   . Stroke Mother   . Hypertension Father   . Depression Father   . Diabetes Father   . Hypertension Sister   . Diabetes Sister   . Hypertension Brother     BP (!) 150/100   Ht 5\' 5"  (1.651 m)   Wt 207 lb (93.9 kg)   BMI 34.45 kg/m   Review of Systems: See HPI above.     Objective:  Physical Exam:  Gen: NAD, comfortable in exam room  Left wrist/hand: No deformity, swelling, bruising. FROM with 5/5 strength. No tenderness to palpation. NVI distally. Negative tinels, positive phalens.  Right wrist/hand: No gross deformity, swelling, bruising. Full extension but unable to fully flex her right 4th digit at PIP joint.  Full flexion all other digits. 5/5 strength flexion and extension all digits including PIP, DIP, MCP 4th digit. Collateral ligaments intact. Mild tenderness volar 4th digit throughout.  No other tenderness. Negative tinels, positive phalens.  MSK u/s:  Mild flexor tenosynovitis noted of right 4th digit.  Right median nerve area 0.07, left 0.11cm2   Assessment & Plan:  1. Bilateral carpal tunnel syndrome - noted with right 4th digit flexor tenosynovitis.  Median nerve volumes suggest mild carpal tunnel especially on left - right side is wnl.  Start with wrist braces at nighttime and as often as possible during the day.  We discussed rx NSAID, prednisone dose pack, bilateral carpal tunnel injections, nerve conduction studies.  She opted for oral prednisone plus wrist braces.  Discussed risks of increased appetite, increased blood sugars, mood changes, difficulty sleeping.  F/u in 1 month but call sooner if she's struggling.  Note written that she must wear wrist braces at work, no repetitive wrist motions - will reevaluate at follow-up.

## 2018-02-02 ENCOUNTER — Telehealth: Payer: Self-pay

## 2018-02-02 NOTE — Telephone Encounter (Signed)
Patient left message that [facility]  Call back is [phone]  Ples SpecterAlisa Price [place], RN [facility] RN)

## 2018-02-05 ENCOUNTER — Emergency Department (HOSPITAL_COMMUNITY)
Admission: EM | Admit: 2018-02-05 | Discharge: 2018-02-06 | Disposition: A | Discharge status: Home / Self Care | Payer: 59 | Attending: Emergency Medicine | Admitting: Emergency Medicine

## 2018-02-05 ENCOUNTER — Encounter (HOSPITAL_COMMUNITY): Payer: Self-pay

## 2018-02-05 ENCOUNTER — Other Ambulatory Visit: Payer: Self-pay

## 2018-02-05 ENCOUNTER — Emergency Department (HOSPITAL_COMMUNITY): Payer: 59

## 2018-02-05 DIAGNOSIS — R05 Cough: Secondary | ICD-10-CM | POA: Diagnosis not present

## 2018-02-05 DIAGNOSIS — I10 Essential (primary) hypertension: Secondary | ICD-10-CM | POA: Insufficient documentation

## 2018-02-05 DIAGNOSIS — R059 Cough, unspecified: Secondary | ICD-10-CM

## 2018-02-05 DIAGNOSIS — M542 Cervicalgia: Secondary | ICD-10-CM | POA: Insufficient documentation

## 2018-02-05 DIAGNOSIS — E119 Type 2 diabetes mellitus without complications: Secondary | ICD-10-CM | POA: Diagnosis not present

## 2018-02-05 DIAGNOSIS — R42 Dizziness and giddiness: Secondary | ICD-10-CM | POA: Insufficient documentation

## 2018-02-05 DIAGNOSIS — F17210 Nicotine dependence, cigarettes, uncomplicated: Secondary | ICD-10-CM | POA: Insufficient documentation

## 2018-02-05 DIAGNOSIS — R5381 Other malaise: Secondary | ICD-10-CM

## 2018-02-05 LAB — URINALYSIS, ROUTINE W REFLEX MICROSCOPIC
Bilirubin Urine: NEGATIVE
GLUCOSE, UA: NEGATIVE mg/dL
Ketones, ur: NEGATIVE mg/dL
NITRITE: NEGATIVE
Protein, ur: NEGATIVE mg/dL
SPECIFIC GRAVITY, URINE: 1.005 (ref 1.005–1.030)
pH: 8 (ref 5.0–8.0)

## 2018-02-05 LAB — CBC
HEMATOCRIT: 42.2 % (ref 36.0–46.0)
HEMOGLOBIN: 13.4 g/dL (ref 12.0–15.0)
MCH: 28.2 pg (ref 26.0–34.0)
MCHC: 31.8 g/dL (ref 30.0–36.0)
MCV: 88.8 fL (ref 80.0–100.0)
NRBC: 0 % (ref 0.0–0.2)
Platelets: 142 10*3/uL — ABNORMAL LOW (ref 150–400)
RBC: 4.75 MIL/uL (ref 3.87–5.11)
RDW: 14.8 % (ref 11.5–15.5)
WBC: 8.2 10*3/uL (ref 4.0–10.5)

## 2018-02-05 LAB — COMPREHENSIVE METABOLIC PANEL
ALT: 33 U/L (ref 0–44)
ANION GAP: 9 (ref 5–15)
AST: 20 U/L (ref 15–41)
Albumin: 3.9 g/dL (ref 3.5–5.0)
Alkaline Phosphatase: 68 U/L (ref 38–126)
BILIRUBIN TOTAL: 0.1 mg/dL — AB (ref 0.3–1.2)
BUN: 9 mg/dL (ref 6–20)
CHLORIDE: 105 mmol/L (ref 98–111)
CO2: 27 mmol/L (ref 22–32)
Calcium: 9.1 mg/dL (ref 8.9–10.3)
Creatinine, Ser: 0.54 mg/dL (ref 0.44–1.00)
Glucose, Bld: 158 mg/dL — ABNORMAL HIGH (ref 70–99)
POTASSIUM: 4.1 mmol/L (ref 3.5–5.1)
Sodium: 141 mmol/L (ref 135–145)
TOTAL PROTEIN: 7.7 g/dL (ref 6.5–8.1)

## 2018-02-05 LAB — I-STAT BETA HCG BLOOD, ED (MC, WL, AP ONLY): I-stat hCG, quantitative: <5 m[IU]/mL (ref <5)

## 2018-02-05 LAB — LIPASE, BLOOD: LIPASE: 30 U/L (ref 11–51)

## 2018-02-05 LAB — CBG MONITORING, ED: Glucose-Capillary: 158 mg/dL — ABNORMAL HIGH (ref 70–99)

## 2018-02-05 MED ORDER — IOPAMIDOL (ISOVUE-370) INJECTION 76%
INTRAVENOUS | Status: AC
  Filled 2018-02-05: qty 100

## 2018-02-05 MED ORDER — SODIUM CHLORIDE (PF) 0.9 % IJ SOLN
INTRAMUSCULAR | Status: AC
  Filled 2018-02-05: qty 50

## 2018-02-05 MED ORDER — SODIUM CHLORIDE 0.9 % IV BOLUS
1000.0000 mL | Freq: Once | INTRAVENOUS | Status: AC
  Administered 2018-02-05: 1000 mL via INTRAVENOUS

## 2018-02-05 MED ORDER — IOPAMIDOL (ISOVUE-370) INJECTION 76%
100.0000 mL | Freq: Once | INTRAVENOUS | Status: AC | PRN
  Administered 2018-02-05: 100 mL via INTRAVENOUS

## 2018-02-05 MED ORDER — DIPHENHYDRAMINE HCL 50 MG/ML IJ SOLN
25.0000 mg | Freq: Once | INTRAMUSCULAR | Status: AC
  Administered 2018-02-05: 25 mg via INTRAVENOUS
  Filled 2018-02-05: qty 1

## 2018-02-05 MED ORDER — PROCHLORPERAZINE EDISYLATE 10 MG/2ML IJ SOLN
10.0000 mg | Freq: Once | INTRAMUSCULAR | Status: AC
  Administered 2018-02-05: 10 mg via INTRAVENOUS
  Filled 2018-02-05: qty 2

## 2018-02-05 NOTE — ED Notes (Signed)
Dr Pilar PlateBero notified of IV access loss, charge nurse Reita ClicheBobby is at the bedside to attempt a new PIV.

## 2018-02-05 NOTE — ED Notes (Signed)
Pt was asking to provide a urine specimen when she went to x-ray, but she was not given a specimen cup. She has a specimen cup at bedside for whenever she is ready.

## 2018-02-05 NOTE — ED Provider Notes (Signed)
[facility] Emergency Department Provider Note MRN:  [id number]  Arrival date & time: 02/06/18     Chief Complaint   Dizziness; Neck Pain; and Abdominal Pain   History of Present Illness   Shirley Hernandez is a 47 y.o. year-old female with a history of hypertension, diabetes presenting to the ED with chief complaint of neck pain.  Patient explains that she began experiencing flulike symptoms last week.  Stuffy nose, cough, body aches, fever.  Began feeling better but the cough has persisted.  For the past 1 to 2 days has been experiencing lightheadedness.  Yesterday had one episode of dizziness that was different, felt like the room was spinning, occurred when she looked up at the sky.  Room spinning sensation lasting only a few moments and then resolving.  Patient woke up this morning with dull left-sided headache as well as left neck pain.  Endorsing continued malaise and fatigue, "funny feeling in her head".  Denies chest pain or shortness of breath, no abdominal pain, no dysuria.  Review of Systems  A complete 10 system review of systems was obtained and all systems are negative except as noted in the HPI and PMH.   Patient's Health History    Past Medical History:  Diagnosis Date  . Anxiety   . Depression   . Diabetes mellitus   . Headache(784.0)   . Hypertension     Past Surgical History:  Procedure Laterality Date  . DILATION AND CURETTAGE OF UTERUS    . DILATION AND CURETTAGE OF UTERUS  03/23/2011   Procedure: DILATATION AND CURETTAGE;  Surgeon: Myra C. Marice Potter, MD;  Location: [facility];  Service: Gynecology;  Laterality: N/A;    Family History  Problem Relation Age of Onset  . Diabetes Mother   . Hypertension Mother   . Stroke Mother   . Hypertension Father   . Depression Father   . Diabetes Father   . Hypertension Sister   . Diabetes Sister   . Hypertension Brother     Social History   Socioeconomic History  . Marital status: Single    Spouse name:  Not on file  . Number of children: Not on file  . Years of education: Not on file  . Highest education level: Not on file  Occupational History  . Not on file  Social Needs  . Financial resource strain: Not on file  . Food insecurity:    Worry: Not on file    Inability: Not on file  . Transportation needs:    Medical: Not on file    Non-medical: Not on file  Tobacco Use  . Smoking status: Current Every Day Smoker    Packs/day: 1.00    Years: 12.00    Pack years: 12.00    Types: Cigarettes  . Smokeless tobacco: Never Used  Substance and Sexual Activity  . Alcohol use: Yes    Alcohol/week: 0.0 standard drinks    Comment: socially  . Drug use: No  . Sexual activity: Yes    Birth control/protection: Injection    Comment: wants depo  Lifestyle  . Physical activity:    Days per week: Not on file    Minutes per session: Not on file  . Stress: Not on file  Relationships  . Social connections:    Talks on phone: Not on file    Gets together: Not on file    Attends religious service: Not on file    Active member of club or  organization: Not on file    Attends meetings of clubs or organizations: Not on file    Relationship status: Not on file  . Intimate partner violence:    Fear of current or ex partner: Not on file    Emotionally abused: Not on file    Physically abused: Not on file    Forced sexual activity: Not on file  Other Topics Concern  . Not on file  Social History Narrative  . Not on file     Physical Exam  Vital Signs and Nursing Notes reviewed Vitals:   02/05/18 1822 02/05/18 1900  BP: (!) 168/116 (!) 145/104  Pulse: 85 81  Resp: 14 19  Temp: 98.4 F (36.9 C)   SpO2: 100% 100%    CONSTITUTIONAL: Well-appearing, NAD NEURO:  Alert and oriented x 3, subtle left facial droop EYES:  eyes equal and reactive, normal extraocular movements, mild left ptosis ENT/NECK:  no LAD, no JVD CARDIO: Regular rate, well-perfused, normal S1 and S2 PULM:  CTAB no  wheezing or rhonchi GI/GU:  normal bowel sounds, non-distended, non-tender MSK/SPINE:  No gross deformities, no edema SKIN:  no rash, atraumatic PSYCH:  Appropriate speech and behavior  Diagnostic and Interventional Summary    EKG Interpretation  Date/Time:    Ventricular Rate:    PR Interval:    QRS Duration:   QT Interval:    QTC Calculation:   R Axis:     Text Interpretation:        Labs Reviewed  COMPREHENSIVE METABOLIC PANEL - Abnormal; Notable for the following components:      Result Value   Glucose, Bld 158 (*)    Total Bilirubin 0.1 (*)    All other components within normal limits  CBC - Abnormal; Notable for the following components:   Platelets 142 (*)    All other components within normal limits  URINALYSIS, ROUTINE W REFLEX MICROSCOPIC - Abnormal; Notable for the following components:   Color, Urine STRAW (*)    APPearance HAZY (*)    Hgb urine dipstick SMALL (*)    Leukocytes, UA SMALL (*)    Bacteria, UA RARE (*)    All other components within normal limits  CBG MONITORING, ED - Abnormal; Notable for the following components:   Glucose-Capillary 158 (*)    All other components within normal limits  LIPASE, BLOOD  I-STAT BETA HCG BLOOD, ED ([facility], [facility], [facility] ONLY)    CT Angio Head W or Wo Contrast  Final Result    CT Angio Neck W and/or Wo Contrast  Final Result    DG Chest 2 View  Final Result      Medications  sodium chloride (PF) 0.9 % injection (has no administration in time range)  iopamidol (ISOVUE-370) 76 % injection (has no administration in time range)  sodium chloride 0.9 % bolus 1,000 mL (1,000 mLs Intravenous New Bag/Given 02/05/18 2022)  diphenhydrAMINE (BENADRYL) injection 25 mg (25 mg Intravenous Given 02/05/18 2017)  prochlorperazine (COMPAZINE) injection 10 mg (10 mg Intravenous Given 02/05/18 2017)  iopamidol (ISOVUE-370) 76 % injection 100 mL (100 mLs Intravenous Contrast Given 02/05/18 2231)     Procedures Critical Care  ED  Course and Medical Decision Making  I have reviewed the triage vital signs and the nursing notes.  Pertinent labs & imaging results that were available during my care of the patient were reviewed by me and considered in my medical decision making (see below for details).  Facial droop, ptosis, headache,  left-sided neck pain, seems unlikely but cannot exclude carotid dissection versus stroke.  Patient woke up with the symptoms, no visual field cuts, no aphasia, no neglect.  Will pursue CT imaging.  Labs unremarkable, urinalysis with no evidence of infection, CT imaging reassuring.  Upon reassessment, patient has no neurological deficits, facial droop resolved.  Unclear if true facial droop versus poor effort, does not appear to be an indication for emergent MRI at this time.  Favoring sequelae of viral illness versus complex migraine.  Patient encouraged follow-up with PCP to determine need for further outpatient testing.  After the discussed management above, the patient was determined to be safe for discharge.  The patient was in agreement with this plan and all questions regarding their care were answered.  ED return precautions were discussed and the patient will return to the ED with any significant worsening of condition.  Elmer Sow. Pilar Plate, MD [facility] Emergency Medicine [facility] [email or web address]  Final Clinical Impressions(s) / ED Diagnoses     ICD-10-CM   1. Malaise R53.81   2. Cough R05 DG Chest 2 View    DG Chest 2 View    ED Discharge Orders    None         Sabas Sous, MD 02/06/18 [phone]

## 2018-02-05 NOTE — ED Triage Notes (Addendum)
Patient c/o dizziness and left neck pain x 4 days. Patient states she had flu symptoms a week ago. Patient denies any syncope or near syncope.  Patient c/o intermittent LLQ pain x 1 week.

## 2018-02-05 NOTE — ED Notes (Signed)
Attempted IV x 2, unable to get access, but obtained labs.

## 2018-02-06 NOTE — Discharge Instructions (Addendum)
You were evaluated in the Emergency Department and after careful evaluation, we did not find any emergent condition requiring admission or further testing in the hospital. ° °Please return to the Emergency Department if you experience any worsening of your condition.  We encourage you to follow up with a primary care provider.  Thank you for allowing us to be a part of your care. °

## 2018-02-07 ENCOUNTER — Other Ambulatory Visit: Payer: Self-pay | Admitting: *Deleted

## 2018-02-07 NOTE — Telephone Encounter (Signed)
Also received request for valsartan.  This has been discontinued.  Will forward to MD to advise. Dalena Plantz, Maryjo RochesterJessica Dawn, CMA

## 2018-02-08 ENCOUNTER — Other Ambulatory Visit: Payer: Self-pay

## 2018-02-08 NOTE — Telephone Encounter (Signed)
Pt called nurse line stating she was recently seen in the ED for neck pain and is now having panic attacks and can not sleep. Pt stated [facility] but its "been a while." I informed patient she would need an apt for this, she has one coming up on 12/5 with you. Nothing sooner is available for PCP apt. Pt wondering if she can have "a few pills to sleep" until 12/5 apt. Please advise.

## 2018-02-08 NOTE — Telephone Encounter (Signed)
Patient called a second time.  [phone]  Ples SpecterAlisa , RN [facility] RN)

## 2018-02-09 ENCOUNTER — Telehealth: Payer: Self-pay

## 2018-02-09 ENCOUNTER — Other Ambulatory Visit: Payer: Self-pay | Admitting: Student in an Organized Health Care Education/Training Program

## 2018-02-09 ENCOUNTER — Ambulatory Visit (INDEPENDENT_AMBULATORY_CARE_PROVIDER_SITE_OTHER): Payer: PRIVATE HEALTH INSURANCE | Admitting: Family Medicine

## 2018-02-09 VITALS — BP 136/94 | Ht 65.0 in | Wt 214.0 lb

## 2018-02-09 DIAGNOSIS — M659 Synovitis and tenosynovitis, unspecified: Secondary | ICD-10-CM

## 2018-02-09 DIAGNOSIS — E119 Type 2 diabetes mellitus without complications: Secondary | ICD-10-CM

## 2018-02-09 DIAGNOSIS — G5603 Carpal tunnel syndrome, bilateral upper limbs: Secondary | ICD-10-CM | POA: Diagnosis not present

## 2018-02-09 MED ORDER — DICLOFENAC SODIUM 75 MG PO TBEC: 75.0000 mg | DELAYED_RELEASE_TABLET | Freq: Two times a day (BID) | ORAL | 1 refills | Status: DC

## 2018-02-09 MED ORDER — BLOOD GLUCOSE METER KIT: PACK | 0 refills | Status: DC

## 2018-02-09 NOTE — Telephone Encounter (Signed)
Unfortunately it would not be good care for me to prescribe sleep medication over the phone without evaluating the patient in person. However, if patient feels it is important to be seen before December I think it would be reasonable to offer her an ATC appointment.

## 2018-02-09 NOTE — Telephone Encounter (Signed)
Contacted pt and she stated that she has an appointment scheduled for this on ATC tomorrow. Lamonte SakaiZimmerman Rumple, April D, [place]

## 2018-02-09 NOTE — Telephone Encounter (Signed)
Spoke to pt. She said at one time she was taking a combo drug that had both amlodipine and valsartan together. Back in June it changed to just the amlodipine but the pharmacy told her today that she was suposto take the Valsartan with the Amlodipine. Please let pt know what we are going to do. She does have an appointment tomorrow but needs BP meds today. Sunday SpillersSharon T Terica Yogi, CMA

## 2018-02-09 NOTE — Telephone Encounter (Signed)
-----   Message from Howard PouchLauren Feng, MD sent at 02/09/2018 11:28 AM EST ----- Regarding: call to confirm meds This patient is asking for a refill on her valsartan (BP medication) but it appears to be discontinued in the chart. I do not see a note on why it was discontinued.   Can you call and confirm with the patient that she is taking both the amlodipine and the valsartan for blood pressure? If so, I will refill both.

## 2018-02-09 NOTE — Patient Instructions (Signed)
You are doing a little better which is great. Continue wearing the braces when you sleep and as often as possible during the day. Start diclofenac 75mg  twice a day with food for pain and inflammation. Consider carpal tunnel injections, 4th digit tendon sheath injection if not improving - call me if you want to do this. Follow up with me in 1 month to 5 weeks for reevaluation.

## 2018-02-09 NOTE — Telephone Encounter (Signed)
Faxed diabetic supplies to [facility]

## 2018-02-10 ENCOUNTER — Ambulatory Visit (INDEPENDENT_AMBULATORY_CARE_PROVIDER_SITE_OTHER): Payer: PRIVATE HEALTH INSURANCE | Admitting: Family Medicine

## 2018-02-10 ENCOUNTER — Other Ambulatory Visit: Payer: Self-pay

## 2018-02-10 ENCOUNTER — Encounter: Payer: Self-pay | Admitting: Family Medicine

## 2018-02-10 VITALS — BP 134/96 | HR 71 | Temp 97.8°F | Ht 65.0 in | Wt 213.0 lb

## 2018-02-10 DIAGNOSIS — E119 Type 2 diabetes mellitus without complications: Secondary | ICD-10-CM | POA: Diagnosis not present

## 2018-02-10 DIAGNOSIS — I10 Essential (primary) hypertension: Secondary | ICD-10-CM | POA: Diagnosis not present

## 2018-02-10 DIAGNOSIS — F419 Anxiety disorder, unspecified: Secondary | ICD-10-CM | POA: Diagnosis not present

## 2018-02-10 MED ORDER — AMLODIPINE BESYLATE 10 MG PO TABS: 10.0000 mg | ORAL_TABLET | Freq: Every day | ORAL | 1 refills | Status: DC

## 2018-02-10 MED ORDER — FREESTYLE LIBRE READER DEVI: 1.0000 | Freq: Once | 0 refills | Status: AC

## 2018-02-10 MED ORDER — MIRTAZAPINE 15 MG PO TABS: 15.0000 mg | ORAL_TABLET | Freq: Every day | ORAL | 0 refills | Status: DC

## 2018-02-10 NOTE — Assessment & Plan Note (Signed)
  Patient requesting new glucometer. rx sent to pharmacy.

## 2018-02-10 NOTE — Assessment & Plan Note (Signed)
  PHQ9 negative, GAD7 score is 12. Patient declines [facility] warm handoff today. She is to continue deep breathing exercises. Will restart remeron for anxiety and sleep as patient did well on this in the past. rx sent to pharmacy. She will follow up with PCP in 2 weeks, already has appt, to follow up on how she is doing with this medication.

## 2018-02-10 NOTE — Progress Notes (Signed)
    Subjective:    Patient ID: Shirley Hernandez, female    DOB: 1970-06-27, 47 y.o.   MRN: [id number]   CC: anxiety  HPI: patient reporting anxiety and difficulty sleeping for the past 2 weeks. She has a history of anxiety related to health condition in 2013-2014. She [facility] and [facility]eron with improvement and states "it just went away". Her first occurrence of anxiety coincided with her diabetes diagnosis. She now is suffering from carpal tunnel and has been out of work due to this. The financial stress of being out of work is weighing on her heavily. She also is worried about her back pain, wrist pain. She reports she cannot relax at night. She does deep breathing exercises with some relief but has taken tylenol pm to help her sleep. She reports a strong FH of anxiety in her father, sister, and son.   She also needs BP meds refilled and new DM meter  Smoking status reviewed- current smoker  Review of Systems- no SI/HI   Objective:  BP (!) 134/96   Pulse 71   Temp 97.8 F (36.6 C) (Oral)   Ht 5\' 5"  (1.651 m)   Wt 213 lb (96.6 kg)   SpO2 98%   BMI 35.45 kg/m  Vitals and nursing note reviewed  General: well nourished, in no acute distress HEENT: normocephalic, MMM Cardiac: regular rate Respiratory: normal work of breathing Extremities: no edema or cyanosis Neuro: alert and oriented, no focal deficits Psych: mood is anxious, affect congruent   Assessment & Plan:    Anxiety  PHQ9 negative, GAD7 score is 12. Patient declines [facility] warm handoff today. She is to continue deep breathing exercises. Will restart remeron for anxiety and sleep as patient did well on this in the past. rx sent to pharmacy. She will follow up with PCP in 2 weeks, already has appt, to follow up on how she is doing with this medication.   Essential hypertension  Patient out of norvasc. BP slightly elevated today. Will refill. bp recheck in 2 weeks at PCP visit  Diabetes mellitus type 2,  controlled [facility](HCC)  Patient requesting new glucometer. rx sent to pharmacy.    Return in about 2 weeks (around 02/24/2018).   Dolores PattyAngela Taje Tondreau, DO Family Medicine Resident PGY-3

## 2018-02-10 NOTE — Assessment & Plan Note (Signed)
  Patient out of norvasc. BP slightly elevated today. Will refill. bp recheck in 2 weeks at PCP visit

## 2018-02-10 NOTE — Patient Instructions (Addendum)
Good to see you today Please start the remeron every night and follow up with your PCP in 2 weeks as scheduled to check in.  I also sent in the freestyle libre diabetes kit. Please pick up the voltaren tabs as well for your back and wrist pain.  If you have questions or concerns please do not hesitate to call at [phone].  Lucila Maine, DO PGY-3, [facility] 02/10/2018 10:35 AM  Panic Attack A panic attack is a sudden episode of severe anxiety, fear, or discomfort that causes physical and emotional symptoms. The attack may be in response to something frightening, or it may occur for no known reason. Symptoms of a panic attack can be similar to symptoms of a heart attack or stroke. It is important to see your health care provider when you have a panic attack so that these conditions can be ruled out. A panic attack is a symptom of another condition. Most panic attacks go away with treatment of the underlying problem. If you have panic attacks often, you may have a condition called panic disorder. What are the causes? A panic attack may be caused by:  An extreme, life-threatening situation, such as a war or natural disaster.  An anxiety disorder, such as post-traumatic stress disorder.  Depression.  Certain medical conditions, including heart problems, neurological conditions, and infections.  Certain over-the-counter and prescription medicines.  Illegal drugs that increase heart rate and blood pressure, such as methamphetamine.  Alcohol.  Supplements that increase anxiety.  Panic disorder.  What increases the risk? You are more likely to develop this condition if:  You have an anxiety disorder.  You have another mental health condition.  You take certain medicines.  You use alcohol, illegal drugs, or other substances.  You are under extreme stress.  A life event is causing increased feelings of anxiety and depression.  What are the signs or  symptoms? A panic attack starts suddenly, usually lasts about 20 minutes, and occurs with one or more of the following:  A pounding heart.  A feeling that your heart is beating irregularly or faster than normal (palpitations).  Sweating.  Trembling or shaking.  Shortness of breath or feeling smothered.  Feeling choked.  Chest pain or discomfort.  Nausea or a strange feeling in your stomach.  Dizziness, feeling lightheaded, or feeling like you might faint.  Chills or hot flashes.  Numbness or tingling in your lips, hands, or feet.  Feeling confused, or feeling that you are not yourself.  Fear of losing control or being emotionally unstable.  Fear of dying.  How is this diagnosed? A panic attack is diagnosed with an assessment by your health care provider. During the assessment your health care provider will ask questions about:  Your history of anxiety, depression, and panic attacks.  Your medical history.  Whether you drink alcohol, use illegal drugs, take supplements, or take medicines. Be honest about your substance use.  Your health care provider may also:  Order blood tests or other kinds of tests to rule out serious medical conditions.  Refer you to a mental health professional for further evaluation.  How is this treated? Treatment depends on the cause of the panic attack:  If the cause is a medical problem, your health care provider will either treat that problem or refer you to a specialist.  If the cause is emotional, you may be given anti-anxiety medicines or referred to a counselor. These medicines may reduce how often attacks happen,  reduce how severe the attacks are, and lower anxiety.  If the cause is a medicine, your health care provider may tell you to stop the medicine, change your dose, or take a different medicine.  If the cause is a drug, treatment may involve letting the drug wear off and taking medicine to help the drug leave your body or  to counteract its effects. Attacks caused by drug abuse may continue even if you stop using the drug.  Follow these instructions at home:  Take over-the-counter and prescription medicines only as told by your health care provider.  If you feel anxious, limit your caffeine intake.  Take good care of your physical and mental health by: ? Eating a balanced diet that includes plenty of fresh fruits and vegetables, whole grains, lean meats, and low-fat dairy. ? Getting plenty of rest. Try to get 7-8 hours of uninterrupted sleep each night. ? Exercising regularly. Try to get 30 minutes of physical activity at least 5 days a week. ? Not smoking. Talk to your health care provider if you need help quitting. ? Limiting alcohol intake to no more than 1 drink a day for nonpregnant women and 2 drinks a day for men. One drink equals 12 oz of beer, 5 oz of wine, or 1 oz of hard liquor.  Keep all follow-up visits as told by your health care provider. This is important. Panic attacks may have underlying physical or emotional problems that take time to accurately diagnose. Contact a health care provider if:  Your symptoms do not improve, or they get worse.  You are not able to take your medicine as prescribed because of side effects. Get help right away if:  You have serious thoughts about hurting yourself or others.  You have symptoms of a panic attack. Do not drive yourself to the hospital. Have someone else drive you or call an ambulance. If you ever feel like you may hurt yourself or others, or you have thoughts about taking your own life, get help right away. You can go to your nearest emergency department or call:  Your local emergency services (911 in the [place]  A suicide crisis helpline, such as the [place] at 430-082-6692. This is open 24 hours a day.  Summary  A panic attack is a sign of a serious health or mental health condition. Get help right away. Do  not drive yourself to the hospital. Have someone else drive you or call an ambulance.  Always see a health care provider to have the reasons for the panic attack correctly diagnosed.  If your panic attack was caused by a physical problem, follow your health care provider's suggestions for medicine, referral to a specialist, and lifestyle changes.  If your panic attack was caused by an emotional problem, follow through with counseling from a qualified mental health specialist.  If you feel like you may hurt yourself or others, call 911 and get help right away. This information is not intended to replace advice given to you by your health care provider. Make sure you discuss any questions you have with your health care provider. Document Released: 03/09/2005 Document Revised: 04/17/2016 Document Reviewed: 04/17/2016 Elsevier Interactive Patient Education  [employer].

## 2018-02-10 NOTE — Progress Notes (Signed)
PCP: Howard PouchFeng, Lauren, MD Consultation requested by Dr. Leveda AnnaHensel  Subjective:   HPI: Patient is a 47 y.o. female here for bilateral wrist/hand pain, right ring finger pain.  10/23: Patient reports she started to get pain in both wrists and hands on volar side dating to June when working at a [employer]. Her job requires cutting chicken repetitively. She states right side started first but both are about the same now. Pain is sharp, worse with sleep and at work. She saw work Engineer, civil (consulting)nurse and was only given ice pack, told to use ibuprofen and use biofreeze. Associated numbness into middle 3 digits of both hands. Associated swelling as well. She reports recent difficulty fully flexing her right ring finger also. No skin changes.  11/20: Patient reports her pain is more of a soreness now at 5-6/10 level volar wrists into hands. Doing better with the braces, wearing at nighttime. Finished prednisone which helped some. Both hands feel weak though when trying to grip something. Motion in right ring finger improved. Decreased numbness in middle 3 digits of hands but improved. No skin changes.  Past Medical History:  Diagnosis Date  . Anxiety   . Depression   . Diabetes mellitus   . Headache(784.0)   . Hypertension     Current Outpatient Medications on File Prior to Visit  Medication Sig Dispense Refill  . acetaminophen (TYLENOL) 500 MG tablet Take 1,000 mg by mouth every 6 (six) hours as needed for mild pain.     Marland Kitchen. amLODipine-valsartan (EXFORGE) 10-320 MG tablet TAKE ONE TABLET BY MOUTH ONCE DAILY (Patient not taking: Reported on 02/05/2018) 30 tablet 11  . glipiZIDE (GLUCOTROL) 5 MG tablet TAKE ONE TABLET BY MOUTH TWICE DAILY BEFORE  A  MEAL. 60 tablet 3  . ibuprofen (ADVIL,MOTRIN) 600 MG tablet Take 1 tablet (600 mg total) by mouth every 8 (eight) hours as needed. Take with food. 100 tablet 6  . medroxyPROGESTERone (DEPO-PROVERA) 150 MG/ML injection Inject 1 mL (150 mg total)  into the muscle every 3 (three) months. 1 mL 0  . metoprolol succinate (TOPROL-XL) 50 MG 24 hr tablet TAKE ONE TABLET BY MOUTH ONCE DAILY 90 tablet 3  . predniSONE (DELTASONE) 10 MG tablet 6 tabs po day 1, 5 tabs po day 2, 4 tabs po day 3, 3 tabs po day 4, 2 tabs po day 5, 1 tab po day 6 (Patient not taking: Reported on 02/05/2018) 21 tablet 0  . SUMAtriptan (IMITREX) 50 MG tablet TAKE ONE TABLET BY MOUTH EVERY 2 HOURS AS NEEDED FOR MIGRAINE. MAY REPEAT IN 2 HOURS IF HEADACHE PERSISTS OR RECURS. 9 tablet 3  . triamcinolone (KENALOG) 0.025 % ointment APPLY TO AFFECTED AREAS DAILY AS NEEDED 80 g 2   No current facility-administered medications on file prior to visit.     Past Surgical History:  Procedure Laterality Date  . DILATION AND CURETTAGE OF UTERUS    . DILATION AND CURETTAGE OF UTERUS  03/23/2011   Procedure: DILATATION AND CURETTAGE;  Surgeon: Myra C. Marice Potterove, MD;  Location: [facility];  Service: Gynecology;  Laterality: N/A;    Allergies  Allergen Reactions  . Metformin And Related Anaphylaxis    Throat swelling    Social History   Socioeconomic History  . Marital status: Single    Spouse name: Not on file  . Number of children: Not on file  . Years of education: Not on file  . Highest education level: Not on file  Occupational History  . Not  on file  Social Needs  . Financial resource strain: Not on file  . Food insecurity:    Worry: Not on file    Inability: Not on file  . Transportation needs:    Medical: Not on file    Non-medical: Not on file  Tobacco Use  . Smoking status: Current Every Day Smoker    Packs/day: 1.00    Years: 12.00    Pack years: 12.00    Types: Cigarettes  . Smokeless tobacco: Never Used  Substance and Sexual Activity  . Alcohol use: Yes    Alcohol/week: 0.0 standard drinks    Comment: socially  . Drug use: No  . Sexual activity: Yes    Birth control/protection: Injection    Comment: wants depo  Lifestyle  . Physical activity:    Days  per week: Not on file    Minutes per session: Not on file  . Stress: Not on file  Relationships  . Social connections:    Talks on phone: Not on file    Gets together: Not on file    Attends religious service: Not on file    Active member of club or organization: Not on file    Attends meetings of clubs or organizations: Not on file    Relationship status: Not on file  . Intimate partner violence:    Fear of current or ex partner: Not on file    Emotionally abused: Not on file    Physically abused: Not on file    Forced sexual activity: Not on file  Other Topics Concern  . Not on file  Social History Narrative  . Not on file    Family History  Problem Relation Age of Onset  . Diabetes Mother   . Hypertension Mother   . Stroke Mother   . Hypertension Father   . Depression Father   . Diabetes Father   . Hypertension Sister   . Diabetes Sister   . Hypertension Brother     BP (!) 136/94   Ht 5\' 5"  (1.651 m)   Wt 214 lb (97.1 kg)   BMI 35.61 kg/m   Review of Systems: See HPI above.     Objective:  Physical Exam:  Gen: NAD, comfortable in exam room  Left wrist/hand: No deformity. FROM with 5/5 strength. No tenderness to palpation. NVI distally. Positive phalens.  Negative tinels.  Right wrist/hand: No gross deformity, swelling, bruising. Full extension, only slight lack of flexion right 4th digit at PIP.  Full flexion other digits. 5/5 strength flexion and extension including 4th digit PIP, DIP, MCP joints. Minimal tenderness proximal 4th digit on volar side.  No other tenderness. Minimal positive tinels, positive phalens.  Assessment & Plan:  1. Bilateral carpal tunnel syndrome - with right 4th digit flexor tenosynovitis.  Caused by repetitive motions at work.  Clinically with mild improvement though exam similar.  S/p prednisone, wrist braces.  Continue with the braes.  Add diclofenac.  Consider carpal tunnel injections as next step, possibly 4th digit  flexor tendon sheath injection.  F/u in 1 month to 5 weeks for reevaluation.

## 2018-02-24 ENCOUNTER — Encounter: Payer: Self-pay | Admitting: Student in an Organized Health Care Education/Training Program

## 2018-02-24 ENCOUNTER — Ambulatory Visit (INDEPENDENT_AMBULATORY_CARE_PROVIDER_SITE_OTHER): Payer: PRIVATE HEALTH INSURANCE | Admitting: Student in an Organized Health Care Education/Training Program

## 2018-02-24 ENCOUNTER — Other Ambulatory Visit: Payer: Self-pay

## 2018-02-24 VITALS — BP 130/74 | HR 98 | Temp 98.1°F | Ht 65.0 in | Wt 221.0 lb

## 2018-02-24 DIAGNOSIS — Z3200 Encounter for pregnancy test, result unknown: Secondary | ICD-10-CM

## 2018-02-24 DIAGNOSIS — Z3202 Encounter for pregnancy test, result negative: Secondary | ICD-10-CM

## 2018-02-24 DIAGNOSIS — I10 Essential (primary) hypertension: Secondary | ICD-10-CM | POA: Diagnosis not present

## 2018-02-24 LAB — POCT URINE PREGNANCY: Preg Test, Ur: NEGATIVE

## 2018-02-24 MED ORDER — AMLODIPINE BESYLATE 10 MG PO TABS: 10.0000 mg | ORAL_TABLET | Freq: Every day | ORAL | 0 refills | Status: DC

## 2018-02-24 MED ORDER — VALSARTAN 40 MG PO TABS: 40.0000 mg | ORAL_TABLET | Freq: Every day | ORAL | 0 refills | Status: DC

## 2018-02-24 NOTE — Patient Instructions (Signed)
It was a pleasure seeing you today in our clinic. Here is the treatment plan we have discussed and agreed upon together:  Please return in 1 week for repeat lab work. Set up a nurse visit and a lab visit for 1 week from now. The nurse visit is to recheck your blood pressure.  Our clinic's number is [phone]. Please call with questions or concerns about what we discussed today.  Be well, Dr. Mosetta PuttFeng

## 2018-02-25 LAB — BASIC METABOLIC PANEL
BUN/Creatinine Ratio: 15 (ref 9–23)
BUN: 14 mg/dL (ref 6–24)
CALCIUM: 9.2 mg/dL (ref 8.7–10.2)
CHLORIDE: 104 mmol/L (ref 96–106)
CO2: 24 mmol/L (ref 20–29)
Creatinine, Ser: 0.93 mg/dL (ref 0.57–1.00)
GFR calc Af Amer: 83 mL/min/{1.73_m2} (ref >59)
GFR calc non Af Amer: 72 mL/min/{1.73_m2} (ref >59)
GLUCOSE: 210 mg/dL — AB (ref 65–99)
Potassium: 4.3 mmol/L (ref 3.5–5.2)
Sodium: 148 mmol/L — ABNORMAL HIGH (ref 134–144)

## 2018-02-27 NOTE — Progress Notes (Signed)
   CC: HTN  HPI: Shirley MallingGail Verstraete is a 47 y.o. female presenting for follow up HTN and a pregnancy test  Antihypertensives - patient was previously on a ARB-norvasc combo pill which was no longer carried by her pharmacy. The norvasc was refilled but she never got the ARB after the pill was split. She reports that when she checks her BP at the grocery store the "bottom number" is always above 100 and the alarm rings on the machine indicating high blood pressure. She is denying headaches, blurred vision, chest pain or dyspnea.  Concern for pregnancy - patient reports her menses is 2 weeks late. She had a pregnancy test in the ED 2 weeks ago which was negative. She is very concerned about possible pregnancy. She has symptoms of reflux which she only had previously during pregnancies and this is causing her distress.   Review of Symptoms:  See HPI for ROS.   CC, SH/smoking status, and VS noted.  Objective: BP 130/74   Pulse 98   Temp 98.1 F (36.7 C) (Oral)   Ht 5\' 5"  (1.651 m)   Wt 221 lb (100.2 kg)   SpO2 98%   BMI 36.78 kg/m  GEN: NAD, alert, cooperative, and pleasant. RESPIRATORY: Comfortable work of breathing, speaks in full sentences CV: Regular rate noted, distal extremities well perfused and warm without edema GI: Soft, nondistended SKIN: warm and dry, no rashes or lesions NEURO: II-XII grossly intact MSK: Moves 4 extremities equally PSYCH: AAOx3, appropriate affect   Assessment and plan:  1. Essential hypertension - BP controlled today but elevated in last 2 visits. She thinks she may have taken norvasc x2 today by accident. Even with this, there is room to improve her BP despite her being at goal. She is very strongly in favor of restarting the valsartan.  - valsartan (DIOVAN) 40 MG tablet; Take 1 tablet (40 mg total) by mouth daily.  Dispense: 90 tablet; Refill: 0 - amLODipine (NORVASC) 10 MG tablet; Take 1 tablet (10 mg total) by mouth daily.  Dispense: 90 tablet; Refill:  0 - Basic metabolic panel for baseline creatinine as it appears she may have been off of the valsartan for several months - Basic metabolic panel; Future: follow up study in 1 week to recheck creatinine - reasons to call back/return to care discussed  2. Possible pregnancy -  More likely reflux + nreaching menapause. Upreg negative. Briefly discussed lifestyle and diet changes to start controlling reflux. Patient to return for further discussion. - POCT urine pregnancy  3. Due for pap - patient to schedule, plan to complete at next visit   Orders Placed This Encounter  Procedures  . Basic metabolic panel  . Basic metabolic panel    Standing Status:   Future    Standing Expiration Date:   02/25/2019  . POCT urine pregnancy    Meds ordered this encounter  Medications  . valsartan (DIOVAN) 40 MG tablet    Sig: Take 1 tablet (40 mg total) by mouth daily.    Dispense:  90 tablet    Refill:  0  . amLODipine (NORVASC) 10 MG tablet    Sig: Take 1 tablet (10 mg total) by mouth daily.    Dispense:  90 tablet    Refill:  0     Howard PouchLauren Ladonya Jerkins, MD,MS,  PGY3 02/27/2018 5:52 AM

## 2018-03-03 ENCOUNTER — Other Ambulatory Visit: Payer: PRIVATE HEALTH INSURANCE

## 2018-03-03 ENCOUNTER — Ambulatory Visit: Payer: PRIVATE HEALTH INSURANCE

## 2018-03-28 ENCOUNTER — Other Ambulatory Visit: Payer: Self-pay | Admitting: *Deleted

## 2018-03-29 ENCOUNTER — Ambulatory Visit (INDEPENDENT_AMBULATORY_CARE_PROVIDER_SITE_OTHER): Payer: BLUE CROSS/BLUE SHIELD | Admitting: Family Medicine

## 2018-03-29 ENCOUNTER — Encounter: Payer: Self-pay | Admitting: Family Medicine

## 2018-03-29 VITALS — BP 144/105 | Ht 65.0 in | Wt 260.0 lb

## 2018-03-29 DIAGNOSIS — G5603 Carpal tunnel syndrome, bilateral upper limbs: Secondary | ICD-10-CM

## 2018-03-29 NOTE — Patient Instructions (Addendum)
We will go ahead with nerve conduction studies (Dr Marilynne Halsted, 05/02/18 @ 315p) of your arms to further assess. Continue the diclofenac and the wrist braces in the meantime. Follow up with me about a week following

## 2018-03-30 ENCOUNTER — Ambulatory Visit: Payer: PRIVATE HEALTH INSURANCE | Admitting: Family Medicine

## 2018-03-30 ENCOUNTER — Encounter: Payer: Self-pay | Admitting: Family Medicine

## 2018-03-30 NOTE — Progress Notes (Signed)
PCP: Everrett Coombe, MD Consultation requested by Dr. Andria Frames  Subjective:   HPI: Patient is a 48 y.o. female here for bilateral wrist/hand pain, right ring finger pain.  10/23: Patient reports she started to get pain in both wrists and hands on volar side dating to June when working at a [employer]. Her job requires cutting chicken repetitively. She states right side started first but both are about the same now. Pain is sharp, worse with sleep and at work. She saw work Marine scientist and was only given ice pack, told to use ibuprofen and use biofreeze. Associated numbness into middle 3 digits of both hands. Associated swelling as well. She reports recent difficulty fully flexing her right ring finger also. No skin changes.  11/20: Patient reports her pain is more of a soreness now at 5-6/10 level volar wrists into hands. Doing better with the braces, wearing at nighttime. Finished prednisone which helped some. Both hands feel weak though when trying to grip something. Motion in right ring finger improved. Decreased numbness in middle 3 digits of hands but improved. No skin changes.  03/29/18: Patient returns with continued pain in both wrists, right ring finger. She states pain is about the same. Reports using wrist braces regularly including at bedtime. Feels like hands are weak. No numbness now. Motion of ring finger on right side better but feels weak. Pain in wrist is circumferential now. No skin changes.  Past Medical History:  Diagnosis Date  . Anxiety   . Depression   . Diabetes mellitus   . Headache(784.0)   . Hypertension     Current Outpatient Medications on File Prior to Visit  Medication Sig Dispense Refill  . acetaminophen (TYLENOL) 500 MG tablet Take 1,000 mg by mouth every 6 (six) hours as needed for mild pain.     Marland Kitchen amLODipine (NORVASC) 10 MG tablet Take 1 tablet (10 mg total) by mouth daily. 90 tablet 0  . blood glucose meter kit and supplies  Dispense based on patient and insurance preference. Use up to four times daily as directed. (FOR ICD-10 E10.9, E11.9). 1 each 0  . diclofenac (VOLTAREN) 75 MG EC tablet Take 1 tablet (75 mg total) by mouth 2 (two) times daily. 60 tablet 1  . glipiZIDE (GLUCOTROL) 5 MG tablet TAKE ONE TABLET BY MOUTH TWICE DAILY BEFORE  A  MEAL. 60 tablet 3  . ibuprofen (ADVIL,MOTRIN) 600 MG tablet Take 1 tablet (600 mg total) by mouth every 8 (eight) hours as needed. Take with food. 100 tablet 6  . medroxyPROGESTERone (DEPO-PROVERA) 150 MG/ML injection Inject 1 mL (150 mg total) into the muscle every 3 (three) months. 1 mL 0  . metoprolol succinate (TOPROL-XL) 50 MG 24 hr tablet TAKE ONE TABLET BY MOUTH ONCE DAILY 90 tablet 3  . mirtazapine (REMERON) 15 MG tablet Take 1 tablet (15 mg total) by mouth at bedtime. 30 tablet 0  . predniSONE (DELTASONE) 10 MG tablet 6 tabs po day 1, 5 tabs po day 2, 4 tabs po day 3, 3 tabs po day 4, 2 tabs po day 5, 1 tab po day 6 (Patient not taking: Reported on 02/05/2018) 21 tablet 0  . SUMAtriptan (IMITREX) 50 MG tablet TAKE ONE TABLET BY MOUTH EVERY 2 HOURS AS NEEDED FOR MIGRAINE. MAY REPEAT IN 2 HOURS IF HEADACHE PERSISTS OR RECURS. 9 tablet 3  . triamcinolone (KENALOG) 0.025 % ointment APPLY TO AFFECTED AREAS DAILY AS NEEDED 80 g 2  . valsartan (DIOVAN) 40 MG tablet  Take 1 tablet (40 mg total) by mouth daily. 90 tablet 0   No current facility-administered medications on file prior to visit.     Past Surgical History:  Procedure Laterality Date  . DILATION AND CURETTAGE OF UTERUS    . DILATION AND CURETTAGE OF UTERUS  03/23/2011   Procedure: DILATATION AND CURETTAGE;  Surgeon: Myra C. Hulan Fray, MD;  Location: [facility];  Service: Gynecology;  Laterality: N/A;    Allergies  Allergen Reactions  . Metformin And Related Anaphylaxis    Throat swelling    Social History   Socioeconomic History  . Marital status: Single    Spouse name: Not on file  . Number of children: Not on  file  . Years of education: Not on file  . Highest education level: Not on file  Occupational History  . Not on file  Social Needs  . Financial resource strain: Not on file  . Food insecurity:    Worry: Not on file    Inability: Not on file  . Transportation needs:    Medical: Not on file    Non-medical: Not on file  Tobacco Use  . Smoking status: Current Every Day Smoker    Packs/day: 1.00    Years: 12.00    Pack years: 12.00    Types: Cigarettes  . Smokeless tobacco: Never Used  Substance and Sexual Activity  . Alcohol use: Yes    Alcohol/week: 0.0 standard drinks    Comment: socially  . Drug use: No  . Sexual activity: Yes    Birth control/protection: Injection    Comment: wants depo  Lifestyle  . Physical activity:    Days per week: Not on file    Minutes per session: Not on file  . Stress: Not on file  Relationships  . Social connections:    Talks on phone: Not on file    Gets together: Not on file    Attends religious service: Not on file    Active member of club or organization: Not on file    Attends meetings of clubs or organizations: Not on file    Relationship status: Not on file  . Intimate partner violence:    Fear of current or ex partner: Not on file    Emotionally abused: Not on file    Physically abused: Not on file    Forced sexual activity: Not on file  Other Topics Concern  . Not on file  Social History Narrative  . Not on file    Family History  Problem Relation Age of Onset  . Diabetes Mother   . Hypertension Mother   . Stroke Mother   . Hypertension Father   . Depression Father   . Diabetes Father   . Hypertension Sister   . Diabetes Sister   . Hypertension Brother     BP (!) 144/105   Ht '5\' 5"'$  (1.651 m)   Wt 260 lb (117.9 kg)   BMI 43.27 kg/m   Review of Systems: See HPI above.     Objective:  Physical Exam:  Gen: NAD, comfortable in exam room  Left wrist/hand: No deformity. FROM with 5/5 strength. Mild TTP  circumferentially. NVI distally. Negative tinels.  Right wrist/hand: No deformity. FROM with 5/5 strength. TTP volar side 4th digits, less about right wrist. NVI distally.  Assessment & Plan:  1. Bilateral carpal tunnel syndrome - with right 4th digit flexor tenosynovitis.  Caused by repetitive motions at work.  Reports continued pain.  Exam has improved but reports weakness.  Advised we go ahead with NCVs/EMGs as next step.  Consider carpal tunnel injection(s), MRI depending on results.  Continue diclofenac, wrist braces.

## 2018-03-31 MED ORDER — GLIPIZIDE 5 MG PO TABS: ORAL_TABLET | ORAL | 0 refills | Status: DC

## 2018-04-07 ENCOUNTER — Other Ambulatory Visit: Payer: Self-pay

## 2018-04-07 MED ORDER — METOPROLOL SUCCINATE ER 50 MG PO TB24: 50.0000 mg | ORAL_TABLET | Freq: Every day | ORAL | 3 refills | Status: DC

## 2018-05-05 ENCOUNTER — Encounter: Payer: Self-pay | Admitting: Family Medicine

## 2018-05-05 ENCOUNTER — Ambulatory Visit (INDEPENDENT_AMBULATORY_CARE_PROVIDER_SITE_OTHER): Payer: BLUE CROSS/BLUE SHIELD | Admitting: Family Medicine

## 2018-05-05 VITALS — BP 122/94 | Ht 65.0 in | Wt 260.0 lb

## 2018-05-05 DIAGNOSIS — G5602 Carpal tunnel syndrome, left upper limb: Secondary | ICD-10-CM | POA: Diagnosis not present

## 2018-05-05 DIAGNOSIS — M659 Synovitis and tenosynovitis, unspecified: Secondary | ICD-10-CM

## 2018-05-05 MED ORDER — METHYLPREDNISOLONE ACETATE 40 MG/ML IJ SUSP
40.0000 mg | Freq: Once | INTRAMUSCULAR | Status: AC
  Administered 2018-05-05: 40 mg via INTRA_ARTICULAR

## 2018-05-05 NOTE — Patient Instructions (Signed)
Wear your wrist braces at night and as often as possible during the day. Follow up with me in 4 weeks for reevaluation.

## 2018-05-06 ENCOUNTER — Encounter: Payer: Self-pay | Admitting: Family Medicine

## 2018-05-06 NOTE — Progress Notes (Signed)
PCP: Everrett Coombe, MD Consultation requested by Dr. Andria Frames  Subjective:   HPI: Patient is a 48 y.o. female here for bilateral wrist/hand pain, right ring finger pain.  10/23: Patient reports she started to get pain in both wrists and hands on volar side dating to June when working at a [employer]. Her job requires cutting chicken repetitively. She states right side started first but both are about the same now. Pain is sharp, worse with sleep and at work. She saw work Marine scientist and was only given ice pack, told to use ibuprofen and use biofreeze. Associated numbness into middle 3 digits of both hands. Associated swelling as well. She reports recent difficulty fully flexing her right ring finger also. No skin changes.  11/20: Patient reports her pain is more of a soreness now at 5-6/10 level volar wrists into hands. Doing better with the braces, wearing at nighttime. Finished prednisone which helped some. Both hands feel weak though when trying to grip something. Motion in right ring finger improved. Decreased numbness in middle 3 digits of hands but improved. No skin changes.  03/29/18: Patient returns with continued pain in both wrists, right ring finger. She states pain is about the same. Reports using wrist braces regularly including at bedtime. Feels like hands are weak. No numbness now. Motion of ring finger on right side better but feels weak. Pain in wrist is circumferential now. No skin changes.  2/13: Patient returns following her NCVs/EMGs. She states still having weakness both hands. Pain improved on right side, ring finger motion improved also. On left side is using a topical rub of green alcohol and icy hot also helps - pain about same on left and sharp. Numbness in hand radiates up to include upper left arm. Wearing wrist braces bilaterally at bedtime which help. No numbness right side.  Past Medical History:  Diagnosis Date  . Anxiety   .  Depression   . Diabetes mellitus   . Headache(784.0)   . Hypertension     Current Outpatient Medications on File Prior to Visit  Medication Sig Dispense Refill  . acetaminophen (TYLENOL) 500 MG tablet Take 1,000 mg by mouth every 6 (six) hours as needed for mild pain.     Marland Kitchen amLODipine (NORVASC) 10 MG tablet Take 1 tablet (10 mg total) by mouth daily. 90 tablet 0  . blood glucose meter kit and supplies Dispense based on patient and insurance preference. Use up to four times daily as directed. (FOR ICD-10 E10.9, E11.9). 1 each 0  . diclofenac (VOLTAREN) 75 MG EC tablet Take 1 tablet (75 mg total) by mouth 2 (two) times daily. 60 tablet 1  . glipiZIDE (GLUCOTROL) 5 MG tablet TAKE ONE TABLET BY MOUTH TWICE DAILY BEFORE  A  MEAL. 180 tablet 0  . ibuprofen (ADVIL,MOTRIN) 600 MG tablet Take 1 tablet (600 mg total) by mouth every 8 (eight) hours as needed. Take with food. 100 tablet 6  . medroxyPROGESTERone (DEPO-PROVERA) 150 MG/ML injection Inject 1 mL (150 mg total) into the muscle every 3 (three) months. 1 mL 0  . metoprolol succinate (TOPROL-XL) 50 MG 24 hr tablet Take 1 tablet (50 mg total) by mouth daily. Take with or immediately following a meal. 90 tablet 3  . mirtazapine (REMERON) 15 MG tablet Take 1 tablet (15 mg total) by mouth at bedtime. 30 tablet 0  . predniSONE (DELTASONE) 10 MG tablet 6 tabs po day 1, 5 tabs po day 2, 4 tabs po day 3, 3  tabs po day 4, 2 tabs po day 5, 1 tab po day 6 (Patient not taking: Reported on 02/05/2018) 21 tablet 0  . SUMAtriptan (IMITREX) 50 MG tablet TAKE ONE TABLET BY MOUTH EVERY 2 HOURS AS NEEDED FOR MIGRAINE. MAY REPEAT IN 2 HOURS IF HEADACHE PERSISTS OR RECURS. 9 tablet 3  . triamcinolone (KENALOG) 0.025 % ointment APPLY TO AFFECTED AREAS DAILY AS NEEDED 80 g 2  . valsartan (DIOVAN) 40 MG tablet Take 1 tablet (40 mg total) by mouth daily. 90 tablet 0   No current facility-administered medications on file prior to visit.     Past Surgical History:   Procedure Laterality Date  . DILATION AND CURETTAGE OF UTERUS    . DILATION AND CURETTAGE OF UTERUS  03/23/2011   Procedure: DILATATION AND CURETTAGE;  Surgeon: Myra C. Hulan Fray, MD;  Location: [facility];  Service: Gynecology;  Laterality: N/A;    Allergies  Allergen Reactions  . Metformin And Related Anaphylaxis    Throat swelling    Social History   Socioeconomic History  . Marital status: Single    Spouse name: Not on file  . Number of children: Not on file  . Years of education: Not on file  . Highest education level: Not on file  Occupational History  . Not on file  Social Needs  . Financial resource strain: Not on file  . Food insecurity:    Worry: Not on file    Inability: Not on file  . Transportation needs:    Medical: Not on file    Non-medical: Not on file  Tobacco Use  . Smoking status: Current Every Day Smoker    Packs/day: 1.00    Years: 12.00    Pack years: 12.00    Types: Cigarettes  . Smokeless tobacco: Never Used  Substance and Sexual Activity  . Alcohol use: Yes    Alcohol/week: 0.0 standard drinks    Comment: socially  . Drug use: No  . Sexual activity: Yes    Birth control/protection: Injection    Comment: wants depo  Lifestyle  . Physical activity:    Days per week: Not on file    Minutes per session: Not on file  . Stress: Not on file  Relationships  . Social connections:    Talks on phone: Not on file    Gets together: Not on file    Attends religious service: Not on file    Active member of club or organization: Not on file    Attends meetings of clubs or organizations: Not on file    Relationship status: Not on file  . Intimate partner violence:    Fear of current or ex partner: Not on file    Emotionally abused: Not on file    Physically abused: Not on file    Forced sexual activity: Not on file  Other Topics Concern  . Not on file  Social History Narrative  . Not on file    Family History  Problem Relation Age of Onset  .  Diabetes Mother   . Hypertension Mother   . Stroke Mother   . Hypertension Father   . Depression Father   . Diabetes Father   . Hypertension Sister   . Diabetes Sister   . Hypertension Brother     BP (!) 122/94   Ht _0  (1.651 m)   Wt 260 lb (117.9 kg)   BMI 43.27 kg/m   Review of Systems: See HPI above.  Objective:  Physical Exam:  Gen: NAD, comfortable in exam room  Left wrist/hand: No deformity. FROM with 5/5 strength digits and wrist. Mild tenderness to palpation volar > dorsal wrist. NVI distally. Negative tinels.  Right wrist/hand: No deformity. FROM with 5/5 strength. Minimal TTP 4th digit on volar side over flexor tendons up to A1 pulley. NVI distally. Negative tinels.  Assessment & Plan:  1. Left carpal tunnel syndrome- confirmed with mild-mod findings on NCV/EMGs.  Caused by repetitive motions at work.  Continued pain with numbness - went ahead with ultrasound guided injection today.  Diclofenac as needed.  Continue using wrist brace at bedtime.  F/u in 4 weeks.  Consider hand surgeon referral if does not respond to injection.  After informed written consent timeout was performed.  Patient was seated on exam table.  Area overlying left carpal tunnel prepped with alcohol swab then utilizing ultrasound guidance patient's left carpal tunnel was injected with 2:1 marcaine: depomedrol.  Patient tolerated procedure well without immediate complications.  2. Right wrist pain - NCVs/EMGs negative for neuropathy including carpal tunnel.  Right 4th digit flexor tenosynovitis.  Possible carpal tunnel component has resolved - symptomatically she is better on this side.  Diclofenac, wrist brace.

## 2018-05-08 ENCOUNTER — Emergency Department (HOSPITAL_COMMUNITY)
Admission: EM | Admit: 2018-05-08 | Discharge: 2018-05-09 | Disposition: A | Discharge status: Home / Self Care | Payer: Self-pay | Attending: Emergency Medicine | Admitting: Emergency Medicine

## 2018-05-08 ENCOUNTER — Emergency Department (HOSPITAL_COMMUNITY): Payer: Self-pay

## 2018-05-08 ENCOUNTER — Other Ambulatory Visit: Payer: Self-pay

## 2018-05-08 ENCOUNTER — Encounter (HOSPITAL_COMMUNITY): Payer: Self-pay | Admitting: *Deleted

## 2018-05-08 DIAGNOSIS — J101 Influenza due to other identified influenza virus with other respiratory manifestations: Secondary | ICD-10-CM

## 2018-05-08 DIAGNOSIS — R739 Hyperglycemia, unspecified: Secondary | ICD-10-CM

## 2018-05-08 DIAGNOSIS — F17210 Nicotine dependence, cigarettes, uncomplicated: Secondary | ICD-10-CM | POA: Insufficient documentation

## 2018-05-08 DIAGNOSIS — R05 Cough: Secondary | ICD-10-CM | POA: Diagnosis not present

## 2018-05-08 DIAGNOSIS — E119 Type 2 diabetes mellitus without complications: Secondary | ICD-10-CM | POA: Diagnosis not present

## 2018-05-08 DIAGNOSIS — I10 Essential (primary) hypertension: Secondary | ICD-10-CM | POA: Insufficient documentation

## 2018-05-08 DIAGNOSIS — Z79899 Other long term (current) drug therapy: Secondary | ICD-10-CM | POA: Insufficient documentation

## 2018-05-08 DIAGNOSIS — R059 Cough, unspecified: Secondary | ICD-10-CM

## 2018-05-08 LAB — BASIC METABOLIC PANEL
Anion gap: 12 (ref 5–15)
BUN: 12 mg/dL (ref 6–20)
CO2: 20 mmol/L — ABNORMAL LOW (ref 22–32)
Calcium: 9 mg/dL (ref 8.9–10.3)
Chloride: 101 mmol/L (ref 98–111)
Creatinine, Ser: 0.7 mg/dL (ref 0.44–1.00)
GFR calc Af Amer: >60 mL/min (ref >60)
GFR calc non Af Amer: >60 mL/min (ref >60)
Glucose, Bld: 140 mg/dL — ABNORMAL HIGH (ref 70–99)
POTASSIUM: 3.6 mmol/L (ref 3.5–5.1)
Sodium: 133 mmol/L — ABNORMAL LOW (ref 135–145)

## 2018-05-08 LAB — CBG MONITORING, ED: Glucose-Capillary: 142 mg/dL — ABNORMAL HIGH (ref 70–99)

## 2018-05-08 MED ORDER — ACETAMINOPHEN 325 MG PO TABS
650.0000 mg | ORAL_TABLET | Freq: Once | ORAL | Status: AC | PRN
  Administered 2018-05-08: 650 mg via ORAL
  Filled 2018-05-08: qty 2

## 2018-05-08 MED ORDER — IPRATROPIUM BROMIDE 0.02 % IN SOLN
0.5000 mg | Freq: Once | RESPIRATORY_TRACT | Status: AC
  Administered 2018-05-08: 0.5 mg via RESPIRATORY_TRACT
  Filled 2018-05-08: qty 2.5

## 2018-05-08 MED ORDER — ALBUTEROL SULFATE (2.5 MG/3ML) 0.083% IN NEBU
5.0000 mg | INHALATION_SOLUTION | Freq: Once | RESPIRATORY_TRACT | Status: AC
  Administered 2018-05-08: 5 mg via RESPIRATORY_TRACT
  Filled 2018-05-08: qty 6

## 2018-05-08 MED ORDER — SODIUM CHLORIDE 0.9 % IV BOLUS
1000.0000 mL | Freq: Once | INTRAVENOUS | Status: AC
  Administered 2018-05-08: 1000 mL via INTRAVENOUS

## 2018-05-08 NOTE — ED Provider Notes (Signed)
[facility] DEPT Provider Note   CSN: [id number] Arrival date & time: 05/08/18  2144     History   Chief Complaint Chief Complaint  Patient presents with  . Cough    HPI Shirley Hernandez is a 48 y.o. female with a hx of anxiety, depression, insulin-dependent diabetes, headache, hypertension presents to the Emergency Department complaining of gradual, persistent, progressively worsening URI symptoms including fever, cough, rhinorrhea, nasal congestion, postnasal drip, chest tightness onset yesterday evening.  Patient reports she went to the pharmacy to buy Sudafed however was told that she would need to see a physician first.  She does report taking Coricidin without relief.  Patient reports she feels fatigued and thinks her blood sugar might be elevated but has not checked it this afternoon.  She denies headache, neck pain, chest pain, abdominal pain, nausea, vomiting, diarrhea, weakness, dizziness, syncope, polyuria, polydipsia, dysuria, hematuria.  No aggravating factors.  No known sick contacts.  Patient did not receive a flu vaccine this year.  Pt is a smoker.  The history is provided by the patient, medical records and a significant other. No language interpreter was used.    Past Medical History:  Diagnosis Date  . Anxiety   . Depression   . Diabetes mellitus   . Headache(784.0)   . Hypertension     Patient Active Problem List   Diagnosis Date Noted  . Anxiety 02/10/2018  . Carpal tunnel syndrome 01/06/2018  . Low back pain without sciatica 10/16/2016  . Hyperlipidemia LDL goal < 100 07/29/2011  . Diabetes mellitus type 2, controlled ([place]) 07/21/2011  . Obesity 07/21/2011  . Menorrhagia 07/08/2011  . Eczema 10/14/2010  . Essential hypertension 04/17/2009    Past Surgical History:  Procedure Laterality Date  . DILATION AND CURETTAGE OF UTERUS    . DILATION AND CURETTAGE OF UTERUS  03/23/2011   Procedure: DILATATION AND CURETTAGE;  Surgeon:  Myra C. Hulan Fray, MD;  Location: [facility];  Service: Gynecology;  Laterality: N/A;     OB History    Gravida  5   Para  3   Term  3   Preterm      AB  2   Living  3     SAB  2   TAB      Ectopic      Multiple      Live Births               Home Medications    Prior to Admission medications   Medication Sig Start Date End Date Taking? Authorizing Provider  acetaminophen (TYLENOL) 500 MG tablet Take 1,000 mg by mouth every 6 (six) hours as needed for mild pain.     [provider]  amLODipine (NORVASC) 10 MG tablet Take 1 tablet (10 mg total) by mouth daily. 02/24/18   Everrett Coombe, MD  blood glucose meter kit and supplies Dispense based on patient and insurance preference. Use up to four times daily as directed. (FOR ICD-10 E10.9, E11.9). 02/09/18   Everrett Coombe, MD  diclofenac (VOLTAREN) 75 MG EC tablet Take 1 tablet (75 mg total) by mouth 2 (two) times daily. 02/09/18   Hudnall, Sharyn Lull, MD  glipiZIDE (GLUCOTROL) 5 MG tablet TAKE ONE TABLET BY MOUTH TWICE DAILY BEFORE  A  MEAL. 03/31/18   Everrett Coombe, MD  ibuprofen (ADVIL,MOTRIN) 600 MG tablet Take 1 tablet (600 mg total) by mouth every 8 (eight) hours as needed. Take with food. 01/06/18  Zenia Resides, MD  medroxyPROGESTERone (DEPO-PROVERA) 150 MG/ML injection Inject 1 mL (150 mg total) into the muscle every 3 (three) months. 01/13/16   Aletha Halim, MD  metoprolol succinate (TOPROL-XL) 50 MG 24 hr tablet Take 1 tablet (50 mg total) by mouth daily. Take with or immediately following a meal. 04/07/18   Everrett Coombe, MD  mirtazapine (REMERON) 15 MG tablet Take 1 tablet (15 mg total) by mouth at bedtime. 02/10/18   Steve Rattler, DO  oseltamivir (TAMIFLU) 75 MG capsule Take 1 capsule (75 mg total) by mouth every 12 (twelve) hours. 05/09/18   Virgel Manifold, MD  predniSONE (DELTASONE) 10 MG tablet 6 tabs po day 1, 5 tabs po day 2, 4 tabs po day 3, 3 tabs po day 4, 2 tabs po day 5, 1 tab po day 6 Patient not  taking: Reported on 02/05/2018 01/12/18   Hudnall, Sharyn Lull, MD  SUMAtriptan (IMITREX) 50 MG tablet TAKE ONE TABLET BY MOUTH EVERY 2 HOURS AS NEEDED FOR MIGRAINE. MAY REPEAT IN 2 HOURS IF HEADACHE PERSISTS OR RECURS. 01/06/18   Zenia Resides, MD  triamcinolone (KENALOG) 0.025 % ointment APPLY TO AFFECTED AREAS DAILY AS NEEDED 01/06/18   Zenia Resides, MD  valsartan (DIOVAN) 40 MG tablet Take 1 tablet (40 mg total) by mouth daily. 02/24/18   Everrett Coombe, MD    Family History Family History  Problem Relation Age of Onset  . Diabetes Mother   . Hypertension Mother   . Stroke Mother   . Hypertension Father   . Depression Father   . Diabetes Father   . Hypertension Sister   . Diabetes Sister   . Hypertension Brother     Social History Social History   Tobacco Use  . Smoking status: Current Every Day Smoker    Packs/day: 1.00    Years: 12.00    Pack years: 12.00    Types: Cigarettes  . Smokeless tobacco: Never Used  Substance Use Topics  . Alcohol use: Yes    Alcohol/week: 0.0 standard drinks    Comment: socially  . Drug use: No     Allergies   Metformin and related   Review of Systems Review of Systems  Constitutional: Positive for fatigue and fever. Negative for appetite change and chills.  HENT: Positive for congestion, postnasal drip, rhinorrhea, sinus pressure and sore throat. Negative for ear discharge, ear pain and mouth sores.   Eyes: Negative for visual disturbance.  Respiratory: Positive for cough, chest tightness, shortness of breath and wheezing. Negative for stridor.   Cardiovascular: Negative for chest pain, palpitations and leg swelling.  Gastrointestinal: Negative for abdominal pain, diarrhea, nausea and vomiting.  Genitourinary: Negative for dysuria, frequency, hematuria and urgency.  Musculoskeletal: Negative for arthralgias, back pain, myalgias and neck stiffness.  Skin: Negative for rash.  Neurological: Negative for syncope, light-headedness,  numbness and headaches.  Hematological: Negative for adenopathy.  Psychiatric/Behavioral: The patient is not nervous/anxious.   All other systems reviewed and are negative.    Physical Exam Updated Vital Signs BP (!) 134/94 (BP Location: Right Arm)   Pulse (!) 134   Temp (!) 102.3 F (39.1 C) (Oral)   Resp (!) 26   Ht '5\' 5"'$  (1.651 m)   Wt 102.1 kg   LMP 04/05/2018   SpO2 99%   BMI 37.44 kg/m   Physical Exam Vitals signs and nursing note reviewed.  Constitutional:      General: She is not in acute distress.  Appearance: She is well-developed. She is not diaphoretic.     Comments: Awake, alert, nontoxic appearance  HENT:     Head: Normocephalic and atraumatic.     Right Ear: Tympanic membrane, ear canal and external ear normal.     Left Ear: Tympanic membrane, ear canal and external ear normal.     Nose: Mucosal edema and rhinorrhea present.     Right Sinus: No maxillary sinus tenderness or frontal sinus tenderness.     Left Sinus: No maxillary sinus tenderness or frontal sinus tenderness.     Mouth/Throat:     Mouth: Mucous membranes are not pale and not cyanotic.     Pharynx: Uvula midline. No oropharyngeal exudate or posterior oropharyngeal erythema.     Tonsils: No tonsillar abscesses.  Eyes:     General: No scleral icterus.    Conjunctiva/sclera: Conjunctivae normal.     Pupils: Pupils are equal, round, and reactive to light.  Neck:     Musculoskeletal: Full passive range of motion without pain, normal range of motion and neck supple.  Cardiovascular:     Rate and Rhythm: Regular rhythm. Tachycardia present.  Pulmonary:     Effort: Pulmonary effort is normal. No respiratory distress.     Breath sounds: No stridor. Wheezing and rhonchi present.     Comments: Faint wheezes throughout with associated rhonchi.  No respiratory distress or tachypnea.  Congested cough.  No focal breath sounds. Abdominal:     General: Bowel sounds are normal.     Palpations: Abdomen  is soft. There is no mass.     Tenderness: There is no abdominal tenderness. There is no guarding or rebound.  Musculoskeletal: Normal range of motion.  Lymphadenopathy:     Cervical: No cervical adenopathy.  Skin:    General: Skin is warm and dry.     Findings: No rash.  Neurological:     Mental Status: She is alert.     Comments: Speech is clear and goal oriented Moves extremities without ataxia      ED Treatments / Results  Labs (all labs ordered are listed, but only abnormal results are displayed) Labs Reviewed  INFLUENZA PANEL BY PCR (TYPE A & B) - Abnormal; Notable for the following components:      Result Value   Influenza A By PCR POSITIVE (*)    All other components within normal limits  CBC WITH DIFFERENTIAL/PLATELET - Abnormal; Notable for the following components:   Platelets 144 (*)    All other components within normal limits  BASIC METABOLIC PANEL - Abnormal; Notable for the following components:   Sodium 133 (*)    CO2 20 (*)    Glucose, Bld 140 (*)    All other components within normal limits  CBG MONITORING, ED - Abnormal; Notable for the following components:   Glucose-Capillary 142 (*)    All other components within normal limits  LACTIC ACID, PLASMA    Radiology Dg Chest 2 View  Result Date: 05/08/2018 CLINICAL DATA:  Initial evaluation for acute cough. History of smoking. EXAM: CHEST - 2 VIEW COMPARISON:  Prior report from 02/05/2018. FINDINGS: The cardiac and mediastinal silhouettes are stable in size and contour, and remain within normal limits. The lungs are normally inflated. Mild scattered bronchial thickening, which could reflect bronchiolitis or changes related to smoking. No consolidative opacity to suggest pneumonia. No edema or effusion. No pneumothorax. No acute osseous abnormality identified. IMPRESSION: 1. Mild scattered peribronchial thickening, which could reflect sequelae of acute  bronchiolitis and/or changes related to smoking. No  consolidative opacity to suggest pneumonia. 2. No other active cardiopulmonary disease. Electronically Signed   By: Jeannine Boga M.D.   On: 05/08/2018 23:22    Procedures Procedures (including critical care time)  Medications Ordered in ED Medications  albuterol (PROVENTIL HFA;VENTOLIN HFA) 108 (90 Base) MCG/ACT inhaler 2 puff (has no administration in time range)  AEROCHAMBER PLUS FLO-VU MEDIUM MISC 1 each (has no administration in time range)  acetaminophen (TYLENOL) tablet 650 mg (650 mg Oral Given 05/08/18 2202)  albuterol (PROVENTIL) (2.5 MG/3ML) 0.083% nebulizer solution 5 mg (5 mg Nebulization Given 05/08/18 2318)  ipratropium (ATROVENT) nebulizer solution 0.5 mg (0.5 mg Nebulization Given 05/08/18 2337)  sodium chloride 0.9 % bolus 1,000 mL (1,000 mLs Intravenous New Bag/Given 05/08/18 2315)  albuterol (PROVENTIL HFA;VENTOLIN HFA) 108 (90 Base) MCG/ACT inhaler 2 puff (2 puffs Inhalation Given 05/09/18 0031)     Initial Impression / Assessment and Plan / ED Course  I have reviewed the triage vital signs and the nursing notes.  Pertinent labs & imaging results that were available during my care of the patient were reviewed by me and considered in my medical decision making (see chart for details).  Clinical Course as of May 09 32  Mon May 09, 2018  0030 Clear and equal breath sounds after albuterol treatment.     [HM]    Clinical Course User Index [HM] Aarya Robinson, Gwenlyn Perking    Patient presents with URI symptoms most consistent with influenza.  On arrival she is febrile, tachycardic, tachypneic.  Patient reports that when she arrived, she was feeling very anxious but is feeling better now.  Heart rate documented at 134 on arrival however she is just barely tachycardic on my exam.  Patient given acetaminophen for her fever at triage.  Will give albuterol, Atrovent and check blood work.  Less likely to be sepsis at this time.  12:31 AM Is well-appearing.  Labs are  reassuring with normal lactic acid.  She is influenza A positive.  CBC resulted with platelets but inability to measure any additional values.  Patient reports she is anxious and wishes to be discharged home.  She reports her chest tightness has improved significantly and the hospital always makes her anxious.  Patient without elevated anion gap.  We do not have a urine on her yet.  She does not wish to stay for urinalysis, further fluids or further treatment.  She is without hypoxia.  Patient remains tachycardic however she is breathing easier.  I suspect some of her tachycardia is anxiety related and some is albuterol related.  She is without chest pain or shortness of breath.  Patient will be given Tamiflu.  Discussed importance of close primary care follow-up and reasons to return immediately to the emergency department.  She states understanding and is in agreement with the plan.  The patient was discussed with and seen by Dr. Wilson Singer who agrees with the treatment plan.    Final Clinical Impressions(s) / ED Diagnoses   Final diagnoses:  Influenza A  Cough  Elevated blood sugar    ED Discharge Orders         Ordered    oseltamivir (TAMIFLU) 75 MG capsule  Every 12 hours     05/09/18 0008           Karlis Cregg, Gwenlyn Perking 05/09/18 0034    Virgel Manifold, MD 05/26/18 [phone]

## 2018-05-08 NOTE — ED Notes (Signed)
Patient transported to X-ray 

## 2018-05-08 NOTE — ED Triage Notes (Signed)
Pt reports a cough that started yesterday.  Pt also presents with a fever.  Pt states her grandson had similar symptoms.  Pt reports back soreness.  Pt denies any other symptoms.

## 2018-05-09 LAB — CBC WITH DIFFERENTIAL/PLATELET
Band Neutrophils: 0 %
Basophils Absolute: 0.1 10*3/uL (ref 0.0–0.1)
Basophils Relative: 1 %
Blasts: 0 %
Eosinophils Absolute: 0 10*3/uL (ref 0.0–0.5)
Eosinophils Relative: 0 %
HCT: 39.1 % (ref 36.0–46.0)
HEMOGLOBIN: 12.8 g/dL (ref 12.0–15.0)
Lymphocytes Relative: 9 %
Lymphs Abs: 0.9 10*3/uL (ref 0.7–4.0)
MCH: 28.7 pg (ref 26.0–34.0)
MCHC: 32.7 g/dL (ref 30.0–36.0)
MCV: 87.7 fL (ref 80.0–100.0)
MYELOCYTES: 0 %
Metamyelocytes Relative: 0 %
Monocytes Absolute: 0.8 10*3/uL (ref 0.1–1.0)
Monocytes Relative: 8 %
Neutro Abs: 8.6 10*3/uL — ABNORMAL HIGH (ref 1.7–7.7)
Neutrophils Relative %: 82 %
Other: 0 %
Platelets: 144 10*3/uL — ABNORMAL LOW (ref 150–400)
Promyelocytes Relative: 0 %
RBC: 4.46 MIL/uL (ref 3.87–5.11)
RDW: 14.8 % (ref 11.5–15.5)
WBC: 10.4 10*3/uL (ref 4.0–10.5)
nRBC: 0 % (ref 0.0–0.2)
nRBC: 0 /100 WBC

## 2018-05-09 LAB — LACTIC ACID, PLASMA: Lactic Acid, Venous: 1.1 mmol/L (ref 0.5–1.9)

## 2018-05-09 LAB — INFLUENZA PANEL BY PCR (TYPE A & B)
INFLAPCR: POSITIVE — AB
Influenza B By PCR: NEGATIVE

## 2018-05-09 MED ORDER — AEROCHAMBER PLUS FLO-VU MEDIUM MISC
1.0000 | Freq: Once | Status: AC
  Administered 2018-05-09: 1
  Filled 2018-05-09: qty 1

## 2018-05-09 MED ORDER — ALBUTEROL SULFATE HFA 108 (90 BASE) MCG/ACT IN AERS: 2.0000 | INHALATION_SPRAY | RESPIRATORY_TRACT | Status: DC | PRN

## 2018-05-09 MED ORDER — ALBUTEROL SULFATE HFA 108 (90 BASE) MCG/ACT IN AERS
2.0000 | INHALATION_SPRAY | Freq: Once | RESPIRATORY_TRACT | Status: AC
  Administered 2018-05-09: 2 via RESPIRATORY_TRACT
  Filled 2018-05-09: qty 6.7

## 2018-05-09 MED ORDER — OSELTAMIVIR PHOSPHATE 75 MG PO CAPS: 75.0000 mg | ORAL_CAPSULE | Freq: Two times a day (BID) | ORAL | 0 refills | Status: DC

## 2018-05-09 NOTE — Discharge Instructions (Addendum)
1. Medications: Tamiflu, albuterol, alternate tylenol and ibuprofen for fever control, usual home medications 2. Treatment: rest, drink plenty of fluids,  3. Follow Up: Please followup with your primary doctor in 2 days for discussion of your diagnoses and further evaluation after today's visit; if you do not have a primary care doctor use the resource guide provided to find one; Please return to the ER for syncope, difficulty breathing, persistent high fevers, intractable vomiting or other concerns

## 2018-05-31 ENCOUNTER — Encounter: Payer: Self-pay | Admitting: Family Medicine

## 2018-05-31 ENCOUNTER — Ambulatory Visit (INDEPENDENT_AMBULATORY_CARE_PROVIDER_SITE_OTHER): Payer: BLUE CROSS/BLUE SHIELD | Admitting: Family Medicine

## 2018-05-31 VITALS — BP 140/108 | Ht 65.0 in | Wt 219.0 lb

## 2018-05-31 DIAGNOSIS — G5602 Carpal tunnel syndrome, left upper limb: Secondary | ICD-10-CM

## 2018-05-31 NOTE — Patient Instructions (Addendum)
We will go ahead with referral to hand surgeon for their input and recommendations.  Dr Betha Loa [facility] [address], [place], [place] [address] Phone: [phone] Wednesday 06/01/18 at 10a Arrival time is 945a  Continue with wrist braces at bedtime and as often as possible during the day especially on this left side. Follow up with me as needed depending on the hand surgeon's recommendations.

## 2018-05-31 NOTE — Progress Notes (Signed)
   Subjective:    Patient ID: Shirley Hernandez, female    DOB: [birth year], 48 y.o.   MRN: [id number]  01/12/18: Patient reports she started to get pain in both wrists and hands on volar side dating to June when working at a [employer]. Her job requires cutting chicken repetitively. She states right side started first but both are about the same now. Pain is sharp, worse with sleep and at work. She saw work Engineer, civil (consulting) and was only given ice pack, told to use ibuprofen and use biofreeze. Associated numbness into middle 3 digits of both hands. Associated swelling as well. She reports recent difficulty fully flexing her right ring finger also. No skin changes.  02/09/18: Patient reports her pain is more of a soreness now at 5-6/10 level volar wrists into hands. Doing better with the braces, wearing at nighttime. Finished prednisone which helped some. Both hands feel weak though when trying to grip something. Motion in right ring finger improved. Decreased numbness in middle 3 digits of hands but improved. No skin changes.  03/29/18: Patient returns with continued pain in both wrists, right ring finger. She states pain is about the same. Reports using wrist braces regularly including at bedtime. Feels like hands are weak. No numbness now. Motion of ring finger on right side better but feels weak. Pain in wrist is circumferential now. No skin changes.  05/05/18: Patient returns following her NCVs/EMGs. She states still having weakness both hands. Pain improved on right side, ring finger motion improved also. On left side is using a topical rub of green alcohol and icy hot also helps - pain about same on left and sharp. Numbness in hand radiates up to include upper left arm. Wearing wrist braces bilaterally at bedtime which help. No numbness right side.   05/31/18 Patient returns following steroid injection in left wrist for carpal tunnel States she is having improvement  of her left wrist although it was worse the first week Pain improved on right side, no numbness States this morning she had numbness in the palm on the left, her pain is more on the back side of her wrist now States she is wearing wrist braces bilaterally at night  ROS per HPI     Objective:   Physical Exam General: NAD, comfortable in exam  Left wrist/hand: No deformity noted. FROM with 5/5 strength digits and wrist. Mild tenderness to palpation volar > dorsal wrist. NVI distally. Negative tinels, phalens.   Right wrist/hand: No deformity noted. FROM with 5/5 strength digits and wrist. No TTP. NVI distally. Negative tinels, phalens.    Assessment & Plan:  1. Left carpal tunnel syndrome: confirmed previously with mild-mod findings on NCV/EMGs. 2/2 repetitive motion at work. Patient with improved symptoms after injection and patient reports still using brace at night. She is continuing to be symptomatic with some reported palmer numbness and pain but also with pain on dorsal aspect of wrist not c/w carpal tunnel. Given that patient remains symptomatic with conservative measures over 5 months will send for further evaluation by surgeon. Follow up will depend on that evaluation.   2. Right wrist pain has resolved, patient to still wear wrist brace.   [place] Kipling Graser, DO PGY-2, [facility]

## 2018-06-01 DIAGNOSIS — M65341 Trigger finger, right ring finger: Secondary | ICD-10-CM | POA: Diagnosis not present

## 2018-06-01 DIAGNOSIS — G5602 Carpal tunnel syndrome, left upper limb: Secondary | ICD-10-CM | POA: Diagnosis not present

## 2018-06-01 DIAGNOSIS — M47812 Spondylosis without myelopathy or radiculopathy, cervical region: Secondary | ICD-10-CM | POA: Diagnosis not present

## 2018-06-02 ENCOUNTER — Ambulatory Visit: Payer: PRIVATE HEALTH INSURANCE | Admitting: Family Medicine

## 2018-06-02 ENCOUNTER — Other Ambulatory Visit: Payer: Self-pay | Admitting: Orthopedic Surgery

## 2018-06-12 ENCOUNTER — Other Ambulatory Visit: Payer: Self-pay | Admitting: Family Medicine

## 2018-06-12 MED ORDER — GLIPIZIDE 5 MG PO TABS: ORAL_TABLET | ORAL | 0 refills | Status: DC

## 2018-06-12 NOTE — Progress Notes (Signed)
Patient called emergency line stating she is out of Glipizide and has sent several requests via her pharmacy to have a 90 day refill but has not received any refills.  Sending in 90 day supply for patient.

## 2018-06-14 ENCOUNTER — Other Ambulatory Visit: Payer: Self-pay

## 2018-06-14 ENCOUNTER — Ambulatory Visit (INDEPENDENT_AMBULATORY_CARE_PROVIDER_SITE_OTHER): Payer: BLUE CROSS/BLUE SHIELD | Admitting: Family Medicine

## 2018-06-14 DIAGNOSIS — G5602 Carpal tunnel syndrome, left upper limb: Secondary | ICD-10-CM

## 2018-06-14 NOTE — Progress Notes (Signed)
Patient came in today to discuss work status with carpal tunnel release being canceled due to non-emergent surgeries being postponed.  Provided her with paperwork but advised her to contact her surgeon for additional recommendations on treatment and work status.

## 2018-06-17 ENCOUNTER — Other Ambulatory Visit: Payer: Self-pay

## 2018-06-17 DIAGNOSIS — E119 Type 2 diabetes mellitus without complications: Secondary | ICD-10-CM

## 2018-06-17 DIAGNOSIS — I10 Essential (primary) hypertension: Secondary | ICD-10-CM

## 2018-06-17 DIAGNOSIS — G43119 Migraine with aura, intractable, without status migrainosus: Secondary | ICD-10-CM

## 2018-06-20 MED ORDER — AMLODIPINE BESYLATE 10 MG PO TABS: 10.0000 mg | ORAL_TABLET | Freq: Every day | ORAL | 0 refills | Status: DC

## 2018-06-20 MED ORDER — METOPROLOL SUCCINATE ER 50 MG PO TB24: 50.0000 mg | ORAL_TABLET | Freq: Every day | ORAL | 3 refills | Status: DC

## 2018-06-20 MED ORDER — BLOOD GLUCOSE METER KIT: PACK | 0 refills | Status: DC

## 2018-06-20 MED ORDER — SUMATRIPTAN SUCCINATE 50 MG PO TABS: ORAL_TABLET | ORAL | 3 refills | Status: DC

## 2018-06-20 MED ORDER — VALSARTAN 40 MG PO TABS: 40.0000 mg | ORAL_TABLET | Freq: Every day | ORAL | 0 refills | Status: DC

## 2018-06-21 ENCOUNTER — Ambulatory Visit: Payer: BLUE CROSS/BLUE SHIELD | Admitting: Family Medicine

## 2018-06-23 ENCOUNTER — Encounter (HOSPITAL_BASED_OUTPATIENT_CLINIC_OR_DEPARTMENT_OTHER): Payer: Self-pay

## 2018-06-23 ENCOUNTER — Ambulatory Visit (HOSPITAL_BASED_OUTPATIENT_CLINIC_OR_DEPARTMENT_OTHER): Admit: 2018-06-23 | Payer: BLUE CROSS/BLUE SHIELD | Admitting: Orthopedic Surgery

## 2018-06-23 SURGERY — CARPAL TUNNEL RELEASE
Anesthesia: Choice | Laterality: Left

## 2018-07-14 ENCOUNTER — Telehealth (INDEPENDENT_AMBULATORY_CARE_PROVIDER_SITE_OTHER): Payer: BLUE CROSS/BLUE SHIELD | Admitting: Family Medicine

## 2018-07-14 DIAGNOSIS — E119 Type 2 diabetes mellitus without complications: Secondary | ICD-10-CM

## 2018-07-14 DIAGNOSIS — J069 Acute upper respiratory infection, unspecified: Secondary | ICD-10-CM | POA: Diagnosis not present

## 2018-07-14 NOTE — Assessment & Plan Note (Signed)
Patient states that she would like telemedicine visit with PCP in the near future to discuss her diabetes medications.

## 2018-07-14 NOTE — Telephone Encounter (Signed)
[facility] Telemedicine Visit  Patient consented to have virtual visit. Method of visit: Telephone  Encounter participants: Patient: Shirley Hernandez - located at home Provider: Leland Her - located at [facility] Others (if applicable): None  Chief Complaint: shortness of breath  HPI:  Has been feeling short of breath for the last 1 week but only with heavy exertion.  She states that she is not short of breath with conversation or at rest.  She typically gets short of breath after going up about 9 steps.  She has no wheezing.  She had a sore throat a week ago but that has since gotten better. She also has had lack of smell or taste. Had a subjective fever the other day. Has also been having stuffy nose.  No cough This feels much milder in comparison to back in February when she had influenza A and was seen in the [facility] at that time. She states that she is concerned that she has coronavirus and wants antibiotics.  She wants to follow-up with her PCP regarding her diabetes soon as she has noticed her sugars have been higher than normal lately  ROS: per HPI  Pertinent PMHx: Diabetes, hypertension, hyperlipidemia  Exam:  Respiratory: Normal work of breathing, speaks in complete sentences without effort  Assessment/Plan:  Viral URI Patient has had subjective fever, shortness of breath, nasal congestion.  Discussed that these could be symptoms of coronavirus and she should self isolate based on CDC guidelines.  Reviewed criteria for when to discontinue self-isolation.  Also reviewed red flag symptoms to either call back or go to the emergency room.  Discussion today regarding lack of indication for antibiotics in a viral illness.  Patient voiced good understanding.  Diabetes mellitus type 2, controlled ([facility]) Patient states that she would like telemedicine visit with PCP in the near future to discuss her diabetes medications.    Time spent during visit  with patient: 21 minutes  Leland Her, DO PGY-3, [facility] 07/14/2018 2:47 PM

## 2018-07-14 NOTE — Assessment & Plan Note (Addendum)
Patient has had subjective fever, shortness of breath, nasal congestion.  Discussed that these could be symptoms of coronavirus and she should self isolate based on CDC guidelines.  Reviewed criteria for when to discontinue self-isolation.  Also reviewed red flag symptoms to either call back or go to the emergency room.  Discussion today regarding lack of indication for antibiotics in a viral illness.  Patient voiced good understanding.

## 2018-08-05 ENCOUNTER — Other Ambulatory Visit: Payer: Self-pay | Admitting: Student in an Organized Health Care Education/Training Program

## 2018-08-16 ENCOUNTER — Other Ambulatory Visit: Payer: Self-pay | Admitting: Student in an Organized Health Care Education/Training Program

## 2018-08-16 ENCOUNTER — Ambulatory Visit: Payer: BLUE CROSS/BLUE SHIELD

## 2018-08-16 NOTE — Telephone Encounter (Signed)
Refilled medication, please have pt come in to see Dr. Mosetta Putt in next several weeks for diabetes follow up. Thank you!

## 2018-08-17 NOTE — Telephone Encounter (Signed)
Pt has appointment scheduled for 08/24/2018 with PCP.  April Zimmerman Rumple, CMA

## 2018-08-24 ENCOUNTER — Encounter: Payer: Self-pay | Admitting: Student in an Organized Health Care Education/Training Program

## 2018-08-24 ENCOUNTER — Ambulatory Visit (INDEPENDENT_AMBULATORY_CARE_PROVIDER_SITE_OTHER): Payer: BC Managed Care – PPO | Admitting: Student in an Organized Health Care Education/Training Program

## 2018-08-24 ENCOUNTER — Other Ambulatory Visit: Payer: Self-pay

## 2018-08-24 VITALS — BP 142/82

## 2018-08-24 DIAGNOSIS — R0600 Dyspnea, unspecified: Secondary | ICD-10-CM

## 2018-08-24 DIAGNOSIS — E119 Type 2 diabetes mellitus without complications: Secondary | ICD-10-CM

## 2018-08-24 LAB — POCT GLYCOSYLATED HEMOGLOBIN (HGB A1C): HbA1c, POC (controlled diabetic range): 8.4 % — AB (ref 0.0–7.0)

## 2018-08-24 MED ORDER — ALBUTEROL SULFATE HFA 108 (90 BASE) MCG/ACT IN AERS: 2.0000 | INHALATION_SPRAY | RESPIRATORY_TRACT | 0 refills | Status: DC | PRN

## 2018-08-24 MED ORDER — EMPAGLIFLOZIN 10 MG PO TABS: 10.0000 mg | ORAL_TABLET | Freq: Every day | ORAL | 3 refills | Status: DC

## 2018-08-24 NOTE — Patient Instructions (Signed)
It was a pleasure seeing you today in our clinic. Here is the treatment plan we have discussed and agreed upon together:  Please schedule follow up with Dr. Raymondo Band for PFT's (pulmonary function testing).   We added a medication to your regimen. Please call me if your blood sugars are less than 100.  Our clinic's number is [phone]. Please call with questions or concerns about what we discussed today.  Be well, Dr. Mosetta Putt

## 2018-08-24 NOTE — Progress Notes (Signed)
CC: Diabetes follow up  HPI: Shirley Hernandez is a 48 y.o. female presenting for T2DM, anxiety, HTN, obesity, HLD   Monitoring Labs and Parameters Last A1C:  Lab Results  Component Value Date   HGBA1C 8.4 (A) 08/24/2018    Last Lipid:     Component Value Date/Time   CHOL 190 07/28/2011 0831   HDL 32 (L) 07/28/2011 0831   LDLDIRECT 113 (H) 09/20/2013 0912    Last Bmet  Potassium  Date Value Ref Range Status  05/08/2018 3.6 3.5 - 5.1 mmol/L Final   Sodium  Date Value Ref Range Status  05/08/2018 133 (L) 135 - 145 mmol/L Final  02/24/2018 148 (H) 134 - 144 mmol/L Final   Creat  Date Value Ref Range Status  08/22/2012 0.88 0.50 - 1.10 mg/dL Final   Creatinine, Ser  Date Value Ref Range Status  05/08/2018 0.70 0.44 - 1.00 mg/dL Final     Last BPs:  BP Readings from Last 3 Encounters:  08/24/18 (!) 142/82  05/31/18 (!) 140/108  05/09/18 (!) 147/78    Review of Symptoms:  See HPI for ROS.   CC, SH/smoking status, and VS noted.  DIABETES Disease Monitoring: A1c 8.4 from 7.0 when last tested 7 months ago Polyuria/phagia/dipsia- denies       Visual problems- denies Medications: Glipizide 5 mg BID, sometimes she takes two pills if she feels like her sugars are hi Hypoglycemic symptoms- denies, lowest CBG she has seen is 114 Microalbuminuria testing deferred as patient is already on an ACE/ARB.  Dyspnea Patient endorses dyspnea that is worst at the top of the stairs. She uses an albuterol inhaler from a previous admission which she finds helpful. No chest pain. No PND. No cough. She does endorse occasional wheezing. She is concerned about lung disease because she has multiple family members with asthma. No rhinorrhea. No fevers. She would like referral for PFTs.   Monitoring Labs and Parameters Last A1C:  Lab Results  Component Value Date   HGBA1C 8.4 (A) 08/24/2018    Last Lipid:     Component Value Date/Time   CHOL 190 07/28/2011 0831   HDL 32 (L)  07/28/2011 0831   LDLDIRECT 113 (H) 09/20/2013 0912    Last Bmet  Potassium  Date Value Ref Range Status  05/08/2018 3.6 3.5 - 5.1 mmol/L Final   Sodium  Date Value Ref Range Status  05/08/2018 133 (L) 135 - 145 mmol/L Final  02/24/2018 148 (H) 134 - 144 mmol/L Final   Creat  Date Value Ref Range Status  08/22/2012 0.88 0.50 - 1.10 mg/dL Final   Creatinine, Ser  Date Value Ref Range Status  05/08/2018 0.70 0.44 - 1.00 mg/dL Final      Last BPs:  BP Readings from Last 3 Encounters:  08/24/18 (!) 142/82  05/31/18 (!) 140/108  05/09/18 (!) 147/78     Objective: BP (!) 142/82  GEN: NAD, alert, cooperative, and pleasant. EYE: no conjunctival injection, pupils equally round and reactive to light ENMT: normal tympanic light reflex, no nasal polyps,no rhinorrhea, no pharyngeal erythema or exudates NECK: full ROM RESPIRATORY: clear to auscultation bilaterally, distant breath sounds due to body habitus with no wheezes, rhonchi or rales, good effort CV: RRR, no m/r/g, no peripheral edema GI: soft, non-tender, non-distended, no hepatosplenomegaly SKIN: warm and dry, no rashes or lesions NEURO: II-XII grossly intact, normal gait, peripheral sensation intact PSYCH: AAOx3, appropriate affect  Assessment and plan:  Diabetes mellitus type 2, controlled (HCC) Patient takes  glipizide BID, sometimes takes an extra pill. A1c worsened from previous. - Add jardiance 10 mg tabs. Advised patient of common and severe side effects to watch for. 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 Dyspnea is worse with walking up stairs. She has an albuterol inhaler from a previous hospitalization which helps, and she is  concerned because she has a strong family hx of asthma. No chest pain, LE Edema, PND. No cough or fever. Consider chronic deconditioning vs lung disease vs cardiac etiology.  - refer to Dr. Raymondo BandKoval for PFTs. - in the future consider cardiac workup if PFTs are WNL - continue albuterol PRN for now   Orders Placed This Encounter  Procedures  . HgB A1c    Meds ordered this encounter  Medications  . empagliflozin (JARDIANCE) 10 MG TABS tablet    Sig: Take 10 mg by mouth daily.    Dispense:  90 tablet    Refill:  3  . albuterol (VENTOLIN HFA) 108 (90 Base) MCG/ACT inhaler    Sig: Inhale 2 puffs into the lungs every 4 (four) hours as needed for wheezing or shortness of breath.    Dispense:  1 Inhaler    Refill:  0   Howard PouchLauren Vernal Hritz, MD,MS,  PGY3 08/25/2018 1:37 PM

## 2018-08-25 ENCOUNTER — Encounter: Payer: Self-pay | Admitting: Student in an Organized Health Care Education/Training Program

## 2018-08-25 DIAGNOSIS — R0600 Dyspnea, unspecified: Secondary | ICD-10-CM | POA: Insufficient documentation

## 2018-08-25 NOTE — Assessment & Plan Note (Signed)
Patient reports wheezing and dyspnea that is chronic. Dyspnea is worse with walking up stairs. She has an albuterol inhaler from a previous hospitalization which helps, and she is concerned because she has a strong family hx of asthma. No chest pain, LE Edema, PND. No cough or fever. Consider chronic deconditioning vs lung disease vs cardiac etiology.  - refer to Dr. Raymondo Band for PFTs. - in the future consider cardiac workup if PFTs are WNL - continue albuterol PRN for now

## 2018-08-25 NOTE — Assessment & Plan Note (Addendum)
Patient takes glipizide BID, sometimes takes an extra pill. A1c worsened from previous. - Add jardiance 10 mg tabs. Advised patient of common and severe side effects to watch for. - continue glipizide for now, monitor for hypoglycemic episodes - patient to call if CBG < 90 - in the future consider titrating up jardiance, potentially coming off of glipizide - follow up in 3 months for next A1c

## 2018-09-02 ENCOUNTER — Ambulatory Visit: Payer: BC Managed Care – PPO | Admitting: Student in an Organized Health Care Education/Training Program

## 2018-09-13 ENCOUNTER — Other Ambulatory Visit: Payer: Self-pay

## 2018-09-13 ENCOUNTER — Ambulatory Visit (INDEPENDENT_AMBULATORY_CARE_PROVIDER_SITE_OTHER): Payer: BC Managed Care – PPO | Admitting: Family Medicine

## 2018-09-13 ENCOUNTER — Encounter: Payer: Self-pay | Admitting: Family Medicine

## 2018-09-13 VITALS — BP 136/84 | HR 107

## 2018-09-13 DIAGNOSIS — E119 Type 2 diabetes mellitus without complications: Secondary | ICD-10-CM

## 2018-09-13 DIAGNOSIS — K122 Cellulitis and abscess of mouth: Secondary | ICD-10-CM | POA: Diagnosis not present

## 2018-09-13 DIAGNOSIS — K047 Periapical abscess without sinus: Secondary | ICD-10-CM | POA: Diagnosis not present

## 2018-09-13 MED ORDER — PENICILLIN V POTASSIUM 500 MG PO TABS: 500.0000 mg | ORAL_TABLET | Freq: Four times a day (QID) | ORAL | 0 refills | Status: AC

## 2018-09-13 NOTE — Patient Instructions (Signed)
Thank you for coming in to see [place] today! Please see below to review our plan for today's visit:  1. Mix Hydrogen Peroxide and water (about 1/2 cup of one and 1/2 of the other), and swish this 4-5 times daily. DO NOT SWALLOW! 2. Take Penicillin V Potassium 500mg  4 times daily for 10 days.  3. Take Ibuprofen 600mg  every 6 hours and Tylenol 1,000mg  every 6 hours as needed for pain. You can take these together!  Please call the clinic at [phone] if your symptoms worsen or you have any concerns. It was our pleasure to serve you!  Dr. Milus Banister [facility]  [employer] for information booklet - Carbohydrate Counting Booklet!  https://www.cornerstones4care.com/content/dam/nni/cornerstones4care/pdf/content/tools-and-resources/Meal_Planning_and_Carbs.pdf   Dental Resources No insurance    [facility] from [facility] Free dental services provided to individuals in need. CreditLoyalty.dk  Free Dentistry Day Free dental days hosted by dental practices. http://www.freedentistryday.org/dates.php [place]) [phone] Portable free dental clinics through [facility] . Provides free dental care. To see upcoming clinic dates, visit   [email or web address]  [place] Applicants must not be able to afford dental care.  Applicants must be permanently disabled, elderly, or medically fragile.  Accepting applications for [employer] veterans and/or people who cannot receive essential medical treatment due to their dental condition, e.g., a kidney transplant. [phone] HandymanRating.si.pdf Applications being accepted in [facility]    The [place] [employer]'s (AACDCF) Give Back a Smile (GBAS) program heals some of the most devastating effects of domestic and sexual  violence by restoring the smiles of adult women and men who have suffered dental injuries from a former intimate partner/spouse, family member, or due to sexual assault. [phone] or http://www.aacd.com/aboutGBAS [facility] [phone] ext. [phone] or jhoneycutt@[facility] .org  [facility] [place] [facility] Card  (need referral from PCP) Monday - Tuesday ([place] (Tuesday and Thursday) [address], [place] [address] [phone] Must be eligible for the [place]   [phone]  ext [phone] Services offered include: Oral prophylaxis (teeth cleaning), Pit and fissure sealants, Dental x-rays, Fluoride treatments, Plaque control instruction Appointment times vary semester-to-semester  [facility] [phone] Information, Appointments and fees [phone] [facility] Information Desk February - June ONLY: clinic provides additional services, including complete exams, fillings, extractions, root canals, crowns, and temporary partial fillings.  [facility] [address] [place], [place] [address] Free dental clinic provided by the [facility] of Dentistry. CoursePop.com.cy Patients selected by a lottery system.  Those with urgent dental needs should call [facility] of Dentistry Urgent Care Clinic at [phone] Monday-Friday 8:00 am-5:00 pm.  Modest fee charged  Additional resources  Starr Lake, DMD                                                                         [place], [place] [address] [phone] Medicaid and Self-Pay Welcome  [facility] [phone] [address], [place] [address]   [phone] Accepts Medicaid & Self-Pay Welcome  Dr. Rebeca Allegra & Dr. Darel Hong [address]  [place],  [address]  [phone]  Accepts Medicaid & Self-Pay  Welcome  Dr. Casey BurkittStacy Greene [address]  [place], [place] [address]  [phone] Accepts Medicaid

## 2018-09-13 NOTE — Progress Notes (Signed)
   Subjective:    Patient ID: Shirley Hernandez, female    DOB: November 27, 1970, 48 y.o.   MRN: [id number]   CC: mouth abscess  HPI:  Mouth Abscess: 2 days ago started having right low jaw swelling, has gotten little bit worse since those 2 days, she's put a warm rag on the outside but this makes the pain worse. Pain is also worse at night time. She has been gargling warm salt water but doesn't work much to soothe pain. She denies having any dental caries, has not seen dentist regularly. Was taking Aleve (2 at a time) and Ibuprofen 600mg  for pain relief. She also denies headaches, difficulty swallowing, difficulty breathing, throat swelling, chest pain, shortness of breath, nausea, vomiting, chills, fevers, and body aches.   Diabetes: the patient also briefly asked about her "sugars". She would like to stop taking Jardiance because it's expensive and "doesn't work, it makes the sugars worse." Last A1c 8.4% earlier this month. The patient also takes Glipizide 5mg  twice daily and Jardiance daily. The patient reports she continues to eat carbohydrate-rich foods.   Review of Systems: see HPI   Objective:  BP 136/84   Pulse (!) 107   SpO2 97%  Vitals and nursing note reviewed  General: well nourished, in no acute distress, non toxic appearing Mouth: poor dentition, no obvious dental caries, appreciate gum and cheek swelling in right lower jaw. Neck: supple, non-tender, tender lymphadenopathy to left anterior cervical chain lymph nodes Cardiac: RRR, clear S1 and S2, no murmurs Respiratory: CTA bilaterally, no increased work of breathing Neuro: alert and oriented, no focal deficits  Assessment & Plan:   Diabetes mellitus type 2, controlled ([place]) -Continue Jardiance 10mg  daily and Glipizide 5mg  BID -Continue to decrease carbohydrates in diet (patient given resource booklet) -Patient would like to walk daily to exercise.  Abscess of mouth 1. Mix Hydrogen Peroxide and water (about 1/2 cup of one and  1/2 of the other), and swish this 4-5 times daily. DO NOT SWALLOW. 2. Take Penicillin V Potassium 500mg  4 times daily for 10 days.  3. Take Ibuprofen 600mg  every 6 hours and Tylenol 1,000mg  every 6 hours as needed for pain. These can be alternated or taken together. 4. Patient given a list of free/low cost dentists in the area - patient instructed to contact these resources for best price and see them ASAP   Return if symptoms worsen or fail to improve.  Dr. Milus Banister [facility], PGY-1

## 2018-09-15 DIAGNOSIS — K122 Cellulitis and abscess of mouth: Secondary | ICD-10-CM | POA: Insufficient documentation

## 2018-09-15 NOTE — Assessment & Plan Note (Signed)
1. Mix Hydrogen Peroxide and water (about 1/2 cup of one and 1/2 of the other), and swish this 4-5 times daily. DO NOT SWALLOW. 2. Take Penicillin V Potassium 500mg  4 times daily for 10 days.  3. Take Ibuprofen 600mg  every 6 hours and Tylenol 1,000mg  every 6 hours as needed for pain. These can be alternated or taken together. 4. Patient given a list of free/low cost dentists in the area - patient instructed to contact these resources for best price and see them ASAP

## 2018-09-15 NOTE — Assessment & Plan Note (Signed)
-  Continue Jardiance 10mg  daily and Glipizide 5mg  BID -Continue to decrease carbohydrates in diet (patient given resource booklet) -Patient would like to walk daily to exercise.

## 2018-09-16 ENCOUNTER — Other Ambulatory Visit: Payer: Self-pay | Admitting: Family Medicine

## 2018-09-16 DIAGNOSIS — L309 Dermatitis, unspecified: Secondary | ICD-10-CM

## 2018-10-10 ENCOUNTER — Other Ambulatory Visit: Payer: Self-pay | Admitting: Student in an Organized Health Care Education/Training Program

## 2018-10-10 DIAGNOSIS — I10 Essential (primary) hypertension: Secondary | ICD-10-CM

## 2018-11-09 ENCOUNTER — Other Ambulatory Visit: Payer: Self-pay | Admitting: *Deleted

## 2018-11-09 MED ORDER — GLIPIZIDE 5 MG PO TABS: ORAL_TABLET | ORAL | 0 refills | Status: DC

## 2018-11-09 NOTE — Telephone Encounter (Signed)
Will refill for one month. Please ask patient to schedule f/u appt for HTN as last appt in Dec 2019 and looked like Dr. Burr Medico was working on titrating meds

## 2019-03-12 ENCOUNTER — Other Ambulatory Visit: Payer: Self-pay | Admitting: Student in an Organized Health Care Education/Training Program

## 2019-03-12 ENCOUNTER — Other Ambulatory Visit: Payer: Self-pay | Admitting: Family Medicine

## 2019-03-12 DIAGNOSIS — L309 Dermatitis, unspecified: Secondary | ICD-10-CM

## 2019-04-10 ENCOUNTER — Other Ambulatory Visit: Payer: Self-pay | Admitting: Student in an Organized Health Care Education/Training Program

## 2019-04-10 ENCOUNTER — Other Ambulatory Visit: Payer: Self-pay | Admitting: Family Medicine

## 2019-04-11 NOTE — Telephone Encounter (Signed)
Please schedule patient for chronic disease follow up for hypertension and diabetes.

## 2019-04-14 ENCOUNTER — Other Ambulatory Visit: Payer: Self-pay | Admitting: Family Medicine

## 2019-04-14 DIAGNOSIS — G5603 Carpal tunnel syndrome, bilateral upper limbs: Secondary | ICD-10-CM

## 2019-04-14 MED ORDER — IBUPROFEN 600 MG PO TABS: 600.0000 mg | ORAL_TABLET | Freq: Three times a day (TID) | ORAL | 0 refills | Status: DC | PRN

## 2019-04-14 NOTE — Telephone Encounter (Signed)
Contacted pt and gave her the below information and scheduled an appointment on 04/24/2019 with PCP.  Pt said she needed a refill on her ibuprofen and I told I would see if PCP would send in enough to get her to her appointment.  I told her it looked like it had been denied and she said she needs it for her back. Leara Rawl Zimmerman Rumple, CMA

## 2019-04-24 ENCOUNTER — Encounter: Payer: Self-pay | Admitting: Family Medicine

## 2019-04-24 ENCOUNTER — Other Ambulatory Visit: Payer: Self-pay

## 2019-04-24 ENCOUNTER — Telehealth (INDEPENDENT_AMBULATORY_CARE_PROVIDER_SITE_OTHER): Payer: Self-pay | Admitting: Family Medicine

## 2019-04-24 DIAGNOSIS — M545 Low back pain: Secondary | ICD-10-CM

## 2019-04-24 DIAGNOSIS — G8929 Other chronic pain: Secondary | ICD-10-CM

## 2019-04-24 NOTE — Progress Notes (Signed)
[facility] Telemedicine Visit  Patient consented to have virtual visit. Method of visit: Video  Encounter participants: Patient: Shirley Hernandez - located at Home Provider: Melene Plan - located at [facility] Others (if applicable): n/a  Chief Complaint: Chronic low back Pain   HPI: Back pain: Has been taking ibuprofen for back pain. Previously 800 and decreased to 600 mg by Dr. Mosetta Putt.  Started a new job and has to stand a lot. Patient reports that she doesn't use it every day. Patient worked for one week and took it for 3/6 days BID. Currently in isolation for COVID exposure. Patient wants to exercise and knows she "needs to lose 50lb" and thinks that is part of the reason her back hurts. Patient reports severe back pain prior to menstrual cycle.  She denies any incontinence, saddle anesthesia, weakness in her legs.  Patient reports that she wakes up from sleeping when she lies on her back.   Exam:  Patient appears well, no acute distress Respiratory: Breathing comfortably  Assessment/Plan:  Low back pain without sciatica Patient referred returns for virtual visit for chronic low back pain.  I received a request for high-dose ibuprofen refill and I had never seen this patient previously.  Patient reports that she is using her medication only when she needs it and usually does not take it every day.  Please see HPI. -Continue ibuprofen for pain as needed -When patient obtains insurance, will obtain BMP to check renal function -Sent patient low back exercises and core exercises in the mail -Sent patient Jackie's card for possible financial assistance until she gets insurance, though nothing is emergent at this time   Time spent during visit with patient: 19 minutes  Melene Plan, M.D.  10:01 AM 04/24/2019

## 2019-04-24 NOTE — Patient Instructions (Signed)
Dear Shirley Hernandez,   It was good to see you! Thank you for taking your time to be seen. Today, we discussed the following:   Back Pain    It is very important to start doing low back exercises to strengthen your core. Please start with a goal of 3 x per week and increase from there   You can also use many other avenues for exercise, such as walking/jogging and YouTube.   Please follow up in 3 months when you get insurance so that we can address other health concerns. Please call Annice Pih to ask about your situation financially and if there are any options to be seen sooner.   Be well,   Genia Hotter, M.D   [facility] [phone]  *Sign up for MyChart for instant access to your health profile, labs, orders, upcoming appointments or to contact your provider with questions*  ===================================================================================  To Keep You Healthy Your are due for the following Health Maintenance Items:  Health Maintenance Due  Topic Date Due  . PNEUMOCOCCAL POLYSACCHARIDE VACCINE AGE 55-64 HIGH RISK  01/03/1971  . FOOT EXAM  01/03/1979  . HIV Screening  01/03/1984  . PAP SMEAR-Modifier  10/14/2011  . TETANUS/TDAP  10/13/2012  . OPHTHALMOLOGY EXAM  12/11/2017  . INFLUENZA VACCINE  10/22/2018  . MAMMOGRAM  01/03/2019  . COLONOSCOPY  01/03/2019  . HEMOGLOBIN A1C  02/23/2019    Please schedule an appointment with your healthcare provider for any questions or concerns regarding your health or any of the items above.

## 2019-04-24 NOTE — Progress Notes (Signed)
[name]  120/89  215#  No fever

## 2019-04-24 NOTE — Assessment & Plan Note (Signed)
Patient referred returns for virtual visit for chronic low back pain.  I received a request for high-dose ibuprofen refill and I had never seen this patient previously.  Patient reports that she is using her medication only when she needs it and usually does not take it every day.  Please see HPI. -Continue ibuprofen for pain as needed -When patient obtains insurance, will obtain BMP to check renal function -Sent patient low back exercises and core exercises in the mail -Sent patient Jackie's card for possible financial assistance until she gets insurance, though nothing is emergent at this time

## 2019-05-15 ENCOUNTER — Other Ambulatory Visit: Payer: Self-pay | Admitting: Family Medicine

## 2019-05-15 DIAGNOSIS — G5603 Carpal tunnel syndrome, bilateral upper limbs: Secondary | ICD-10-CM

## 2019-07-02 ENCOUNTER — Other Ambulatory Visit: Payer: Self-pay | Admitting: Family Medicine

## 2019-07-13 ENCOUNTER — Other Ambulatory Visit: Payer: Self-pay

## 2019-07-13 DIAGNOSIS — I10 Essential (primary) hypertension: Secondary | ICD-10-CM

## 2019-07-13 MED ORDER — METOPROLOL SUCCINATE ER 50 MG PO TB24: 50.0000 mg | ORAL_TABLET | Freq: Every day | ORAL | 3 refills | Status: DC

## 2019-07-13 MED ORDER — AMLODIPINE BESYLATE 10 MG PO TABS: 10.0000 mg | ORAL_TABLET | Freq: Every day | ORAL | 0 refills | Status: DC

## 2019-07-13 MED ORDER — BLOOD PRESSURE MONITOR AUTOMAT DEVI: 1.0000 | Freq: Every day | 0 refills | Status: DC

## 2019-07-13 NOTE — Addendum Note (Signed)
Addended by: Genia Hotter E on: 07/13/2019 12:12 PM   Modules accepted: Orders

## 2019-07-13 NOTE — Telephone Encounter (Signed)
Patient needs appointment for future refills.  Previously, patient noted that she did not have insurance.  Given her ibuprofen use and hypertension, I think it is very important for [place] to get labs.  I would ideally like to see her in the office of possible.  Please call and make appointment.

## 2019-07-13 NOTE — Telephone Encounter (Signed)
Spoke to pt. She works in [employer] in the afternoon so she can only make an appt for early am. The first appt Dr. Selena Batten has on on 6/3. Pt said she can come by one day in the am to get blood work done before then if that will help. Please advise. Sunday Spillers, CMA

## 2019-07-13 NOTE — Telephone Encounter (Signed)
If she could come by in the morning for labs, that would be wonderful.  I will place the order for future. We can also do a blood pressure check at that time if possible.  I am going to send a prescription for blood pressure cuff to patient's pharmacy.  Please ask patient to measure her blood pressures daily and bring values to next visit.. I can follow-up with patient either virtually or on 6/3.  Thank you so much!

## 2019-08-24 ENCOUNTER — Ambulatory Visit: Payer: Self-pay | Admitting: Family Medicine

## 2019-10-06 ENCOUNTER — Other Ambulatory Visit: Payer: Self-pay | Admitting: Family Medicine

## 2019-10-06 NOTE — Telephone Encounter (Signed)
Patient needs follow up for chronic disease. She has not been seen in a year. Will not fill medications for her until follow up.

## 2020-01-09 ENCOUNTER — Other Ambulatory Visit: Payer: Self-pay | Admitting: Family Medicine

## 2020-01-09 DIAGNOSIS — I10 Essential (primary) hypertension: Secondary | ICD-10-CM

## 2020-03-06 ENCOUNTER — Other Ambulatory Visit: Payer: Self-pay | Admitting: Family Medicine

## 2020-03-18 IMAGING — CR DG CHEST 2V
2 series · 2 of 2 positions shown · non-contrast
Comparison: Prior report from 02/05/2018.

CLINICAL DATA: Initial evaluation for acute cough. History of
smoking.

EXAM:
CHEST - 2 VIEW

[w chest pa]
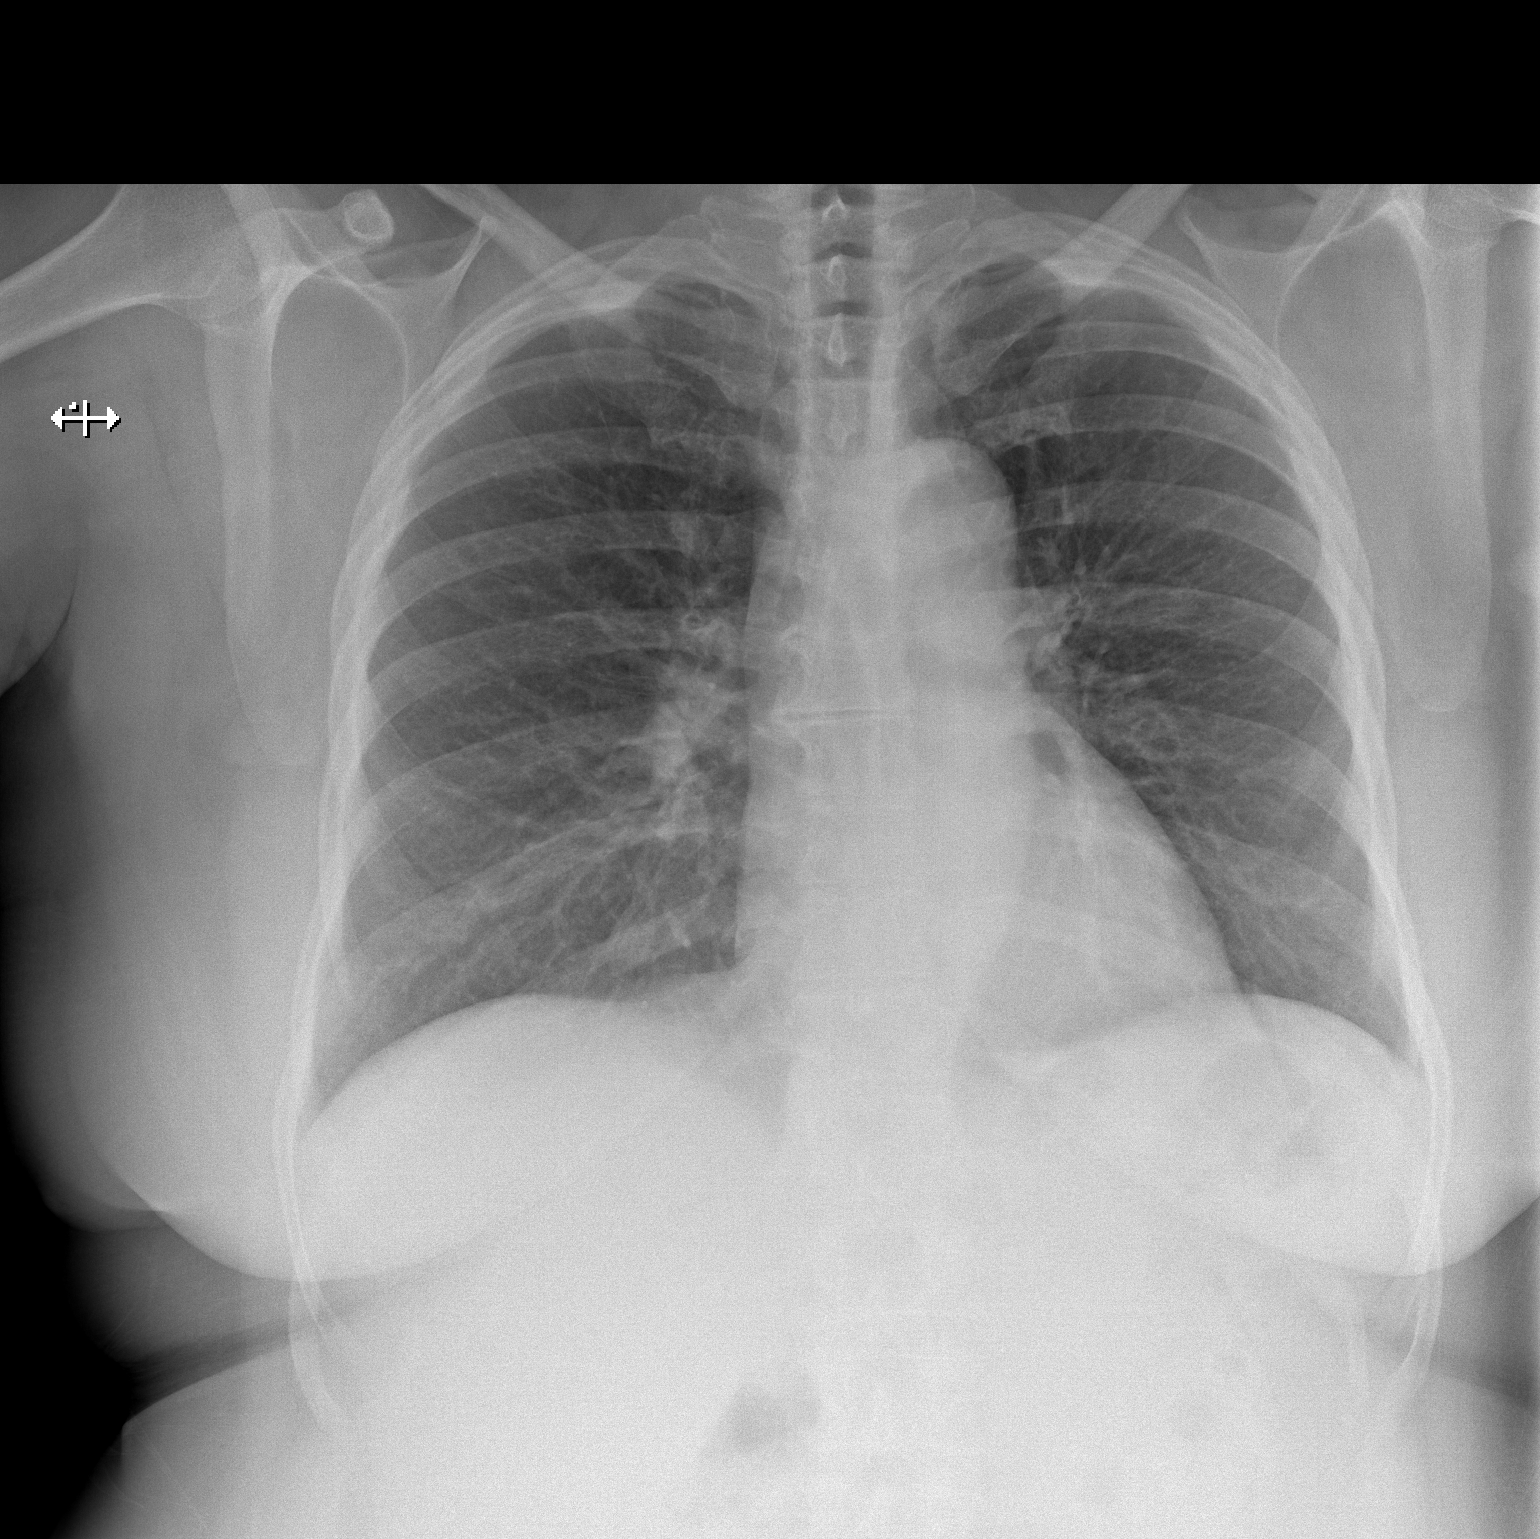

[w chest lat]
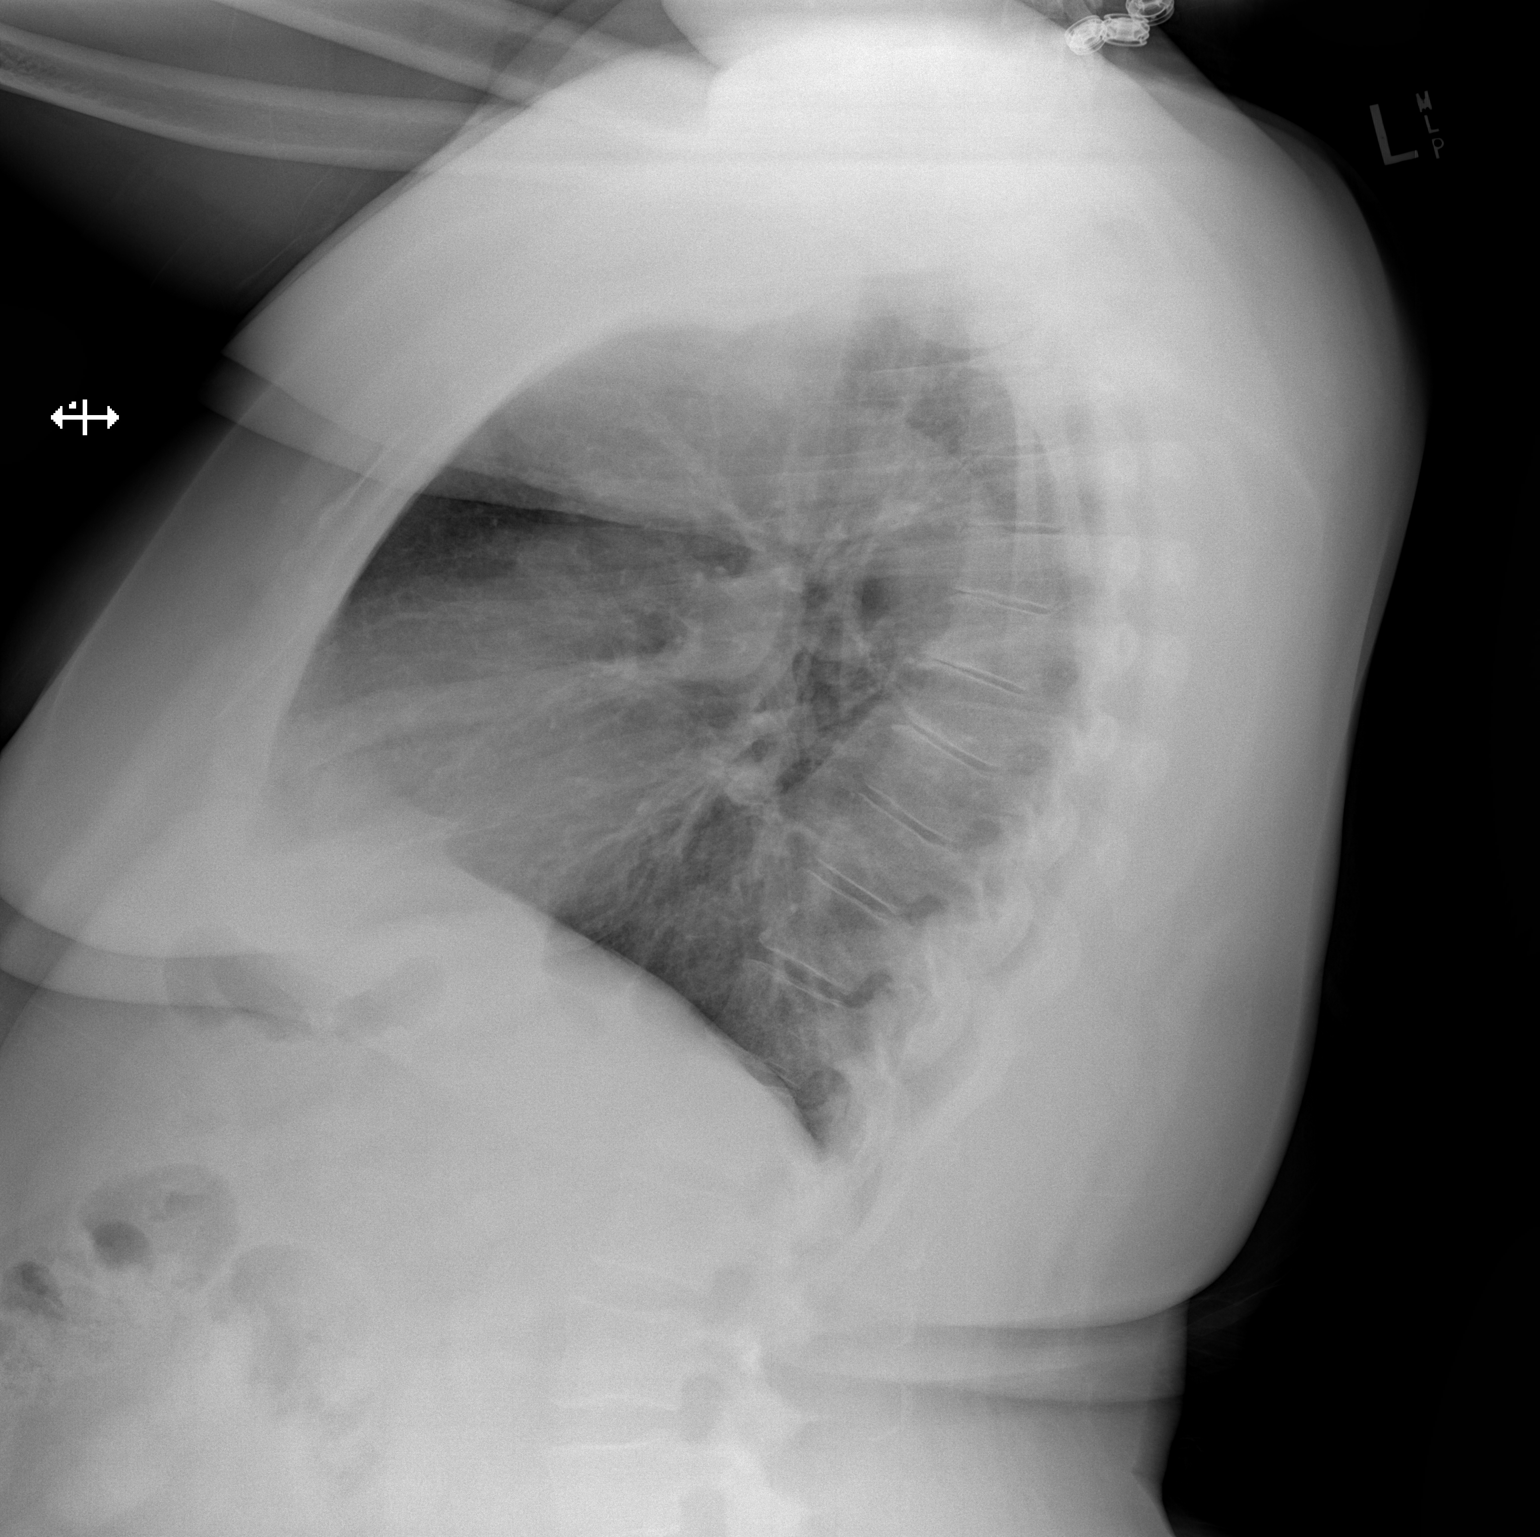

[2 of 2 positions shown; findings below may reference images not displayed]

FINDINGS: The cardiac and mediastinal silhouettes are stable in size and
contour, and remain within normal limits.

The lungs are normally inflated. Mild scattered bronchial
thickening, which could reflect bronchiolitis or changes related to
smoking. No consolidative opacity to suggest pneumonia. No edema or
effusion. No pneumothorax.

No acute osseous abnormality identified.
IMPRESSION: 1. Mild scattered peribronchial thickening, which could reflect
sequelae of acute bronchiolitis and/or changes related to smoking.
No consolidative opacity to suggest pneumonia.
2. No other active cardiopulmonary disease.

## 2020-03-24 ENCOUNTER — Other Ambulatory Visit: Payer: Self-pay | Admitting: Family Medicine

## 2020-05-18 NOTE — Progress Notes (Addendum)
SUBJECTIVE:   CHIEF COMPLAINT / HPI:   Shirley Hernandez is a 50 y.o. female presents for diabetic follow up   Diabetes Patient's current diabetic medications include Glucotrol. Did not have insurance so was not taking Jardiance.Tolerating well without side effects.  Patient endorses compliance with these medications. CBG readings averaging in the 300 range.  Patient's last A1c was  Lab Results  Component Value Date   HGBA1C 13.0 (A) 05/21/2020   HGBA1C 8.4 (A) 08/24/2018   HGBA1C 7.0 01/06/2018  Current A1c today is 13.  Reports feeling tired, abdominal pain, polyuria and polydipsia. She has been eating lots of sweet foods.  Patient states they understand that diet and exercise can help with her diabetes.   Last Microalbumin, LDL, Creatinine: Lab Results  Component Value Date   LDLCALC 133 (H) 07/28/2011   CREATININE 0.70 05/08/2018    Hypertension: Patient's current antihypertensive  medications include: Metoprolol, amlodipine and Valsartan. Compliant with medications and tolerating well without side effects.  Checking BP at home with readings between 120  and 164.    Most recent creatinine trend:  Lab Results  Component Value Date   CREATININE 0.70 05/08/2018   CREATININE 0.93 02/24/2018   CREATININE 0.54 02/05/2018     Patient has not had a BMP in the past 1 year.  Headache  Onset: several years,  Location: side of temples  Quality: heavy  Frequency: last a day, twice a week  Precipitating factors:worse with stress, take cares of all grand kids sinc eson died last Jun 20, 2022. rior treatment: Sumatriptan   Associated Symptoms Nausea/vomiting: yes  Photophobia/phonophobia: yes  Tearing of eyes: no  Sinus pain/pressure: no  Family hx migraine: yes  Personal stressors: yes see above  Relation to menstrual cycle: no    Red Flags Fever: no  Neck pain/stiffness: yes but not currently  Vision/speech/swallow/hearing difficulty: no  Focal weakness/numbness: no   Altered mental status: no  Trauma: no  New type of headache: no  Anticoagulant use: no  H/o cancer/HIV/Pregnancy: no     Health Maintenance Due  Topic  . Hepatitis C Screening   . PNEUMOCOCCAL POLYSACCHARIDE VACCINE AGE 31-64 HIGH RISK   . COVID-19 Vaccine (1)  . FOOT EXAM   . HIV Screening   . PAP SMEAR-Modifier   . COLONOSCOPY (Pts 45-70yrs Insurance coverage will need to be confirmed)   . OPHTHALMOLOGY EXAM   . MAMMOGRAM   . INFLUENZA VACCINE      PERTINENT  PMH / PSH:   OBJECTIVE:   BP 119/80   Pulse (!) 104   Ht $R'5\' 5"'Nq$  (1.651 m)   Wt 223 lb 3.2 oz (101.2 kg)   SpO2 98%   BMI 37.14 kg/m    General: Alert, no acute distress, pleasant, cooperative  Cardio: Normal S1 and S2, RRR, no r/m/g Pulm: CTAB, normal work of breathing Abdomen: Bowel sounds normal. Abdomen soft and non-tender.  Extremities: No peripheral edema.  Neuro: Cranial nerves grossly intact   ASSESSMENT/PLAN:   Essential hypertension BP 119/80. No changes made today. Continue metoprolol, losartan and amlodipine at same dose.   Diabetes mellitus type 2, controlled ([place]) A1c 13 worsened from previous which was 8.4 in Jun 20, 2018.  During this time patient had issues with insurance and so was unable to afford Jardiance and come to doctor's visits.  Pt has sx of uncontrolled hyperglycemia-fatigue, abdominal pain, polyuria and polydipsia at times.  She is aware that her diet needs to improve and she will make these  changes and also incorporate exercise.  Provided diabetic education today. Started patient on Lantus 15 units.  Recommended increasing Lantus dose every day by 2 units until her CBG goal of 140 is met.  She will follow up next week in clinic and has a follow-up with me the week after.  Obtained a BMP.  If patient has a normal GFR she can start back on the Jardiance. Continue glipizide 5 mg twice daily.  ER precautions given to patient.  Migraines Likely triggered by stress. No red flags. Refilled  Imitrex.  Hyperlipidemia LDL goal < 100 Obtained lipid panel today.     Lattie Haw, MD PGY-2 [facility]

## 2020-05-21 ENCOUNTER — Encounter: Payer: Self-pay | Admitting: Family Medicine

## 2020-05-21 ENCOUNTER — Telehealth: Payer: Self-pay

## 2020-05-21 ENCOUNTER — Ambulatory Visit (INDEPENDENT_AMBULATORY_CARE_PROVIDER_SITE_OTHER): Payer: 59 | Admitting: Family Medicine

## 2020-05-21 ENCOUNTER — Other Ambulatory Visit: Payer: Self-pay

## 2020-05-21 VITALS — BP 119/80 | HR 104 | Ht 65.0 in | Wt 223.2 lb

## 2020-05-21 DIAGNOSIS — E119 Type 2 diabetes mellitus without complications: Secondary | ICD-10-CM | POA: Diagnosis not present

## 2020-05-21 DIAGNOSIS — G43119 Migraine with aura, intractable, without status migrainosus: Secondary | ICD-10-CM | POA: Diagnosis not present

## 2020-05-21 DIAGNOSIS — I10 Essential (primary) hypertension: Secondary | ICD-10-CM | POA: Diagnosis not present

## 2020-05-21 LAB — POCT GLYCOSYLATED HEMOGLOBIN (HGB A1C): Hemoglobin A1C: 13 % — AB (ref 4.0–5.6)

## 2020-05-21 MED ORDER — GLIPIZIDE 5 MG PO TABS
ORAL_TABLET | ORAL | 0 refills | Status: DC
Start: 2020-05-21 — End: 2020-08-20

## 2020-05-21 MED ORDER — ALBUTEROL SULFATE HFA 108 (90 BASE) MCG/ACT IN AERS: 2.0000 | INHALATION_SPRAY | RESPIRATORY_TRACT | 0 refills | Status: DC | PRN

## 2020-05-21 MED ORDER — INSULIN GLARGINE 100 UNIT/ML SOLOSTAR PEN: 15.0000 [IU] | PEN_INJECTOR | SUBCUTANEOUS | 11 refills | Status: DC

## 2020-05-21 MED ORDER — MIRTAZAPINE 15 MG PO TABS: 15.0000 mg | ORAL_TABLET | Freq: Every day | ORAL | 0 refills | Status: DC

## 2020-05-21 MED ORDER — AMLODIPINE BESYLATE 10 MG PO TABS: 10.0000 mg | ORAL_TABLET | Freq: Every day | ORAL | 0 refills | Status: DC

## 2020-05-21 MED ORDER — SUMATRIPTAN SUCCINATE 50 MG PO TABS: 50.0000 mg | ORAL_TABLET | ORAL | 0 refills | Status: DC | PRN

## 2020-05-21 MED ORDER — METOPROLOL SUCCINATE ER 50 MG PO TB24: 50.0000 mg | ORAL_TABLET | Freq: Every day | ORAL | 3 refills | Status: DC

## 2020-05-21 NOTE — Assessment & Plan Note (Signed)
Obtained lipid panel today. 

## 2020-05-21 NOTE — Assessment & Plan Note (Signed)
BP 119/80. No changes made today. Continue metoprolol, losartan and amlodipine at same dose.

## 2020-05-21 NOTE — Telephone Encounter (Signed)
Patient calls nurse line regarding cost of Lantus. Patient reports that medication will cost $300.00 and that she is unable to afford this cost.   Called and spoke with pharmacist. This is the preferred insulin, however, the copay is $300. Pharmacist checked multiple alteratives that were comparable to Lantus, however, none of these were covered by insurance.   Please advise how patient should proceed. I will also include pharmacy team for further recommendations.   Veronda Prude, RN

## 2020-05-21 NOTE — Patient Instructions (Signed)
Thank you for coming to see me today. It was a pleasure. Today we discussed your diabetes. The plan is to check sugars in the morning. Lantus 15 units daily. Add 2 units on daily until your sugars 140.  Please follow-up in the clinic in 1 week to follow up on diabetic control.   If you have any questions or concerns, please do not hesitate to call the office at [phone].  We will get some labs today.  If they are abnormal or we need to do something about them, I will call you.  If they are normal, I will send you a message on MyChart (if it is active) or a letter in the mail.  If you don't hear from [place] in 2 weeks, please call the office at the number below.  Best wishes,   Dr Allena Katz

## 2020-05-21 NOTE — Assessment & Plan Note (Signed)
Likely triggered by stress. No red flags. Refilled Imitrex.

## 2020-05-21 NOTE — Assessment & Plan Note (Addendum)
A1c 13 worsened from previous which was 8.4 in 2020.  During this time patient had issues with insurance and so was unable to afford Jardiance and come to doctor's visits.  Pt has sx of uncontrolled hyperglycemia-fatigue, abdominal pain, polyuria and polydipsia at times.  She is aware that her diet needs to improve and she will make these changes and also incorporate exercise.  Provided diabetic education today. Started patient on Lantus 15 units.  Recommended increasing Lantus dose every day by 2 units until her CBG goal of 140 is met.  She will follow up next week in clinic and has a follow-up with me the week after.  Obtained a BMP.  If patient has a normal GFR she can start back on the Jardiance. Continue glipizide 5 mg twice daily.  ER precautions given to patient.

## 2020-05-22 LAB — LIPID PANEL
Chol/HDL Ratio: 6.3 ratio — ABNORMAL HIGH (ref 0.0–4.4)
Cholesterol, Total: 214 mg/dL — ABNORMAL HIGH (ref 100–199)
HDL: 34 mg/dL — ABNORMAL LOW (ref >39)
LDL Chol Calc (NIH): 125 mg/dL — ABNORMAL HIGH (ref 0–99)
Triglycerides: 311 mg/dL — ABNORMAL HIGH (ref 0–149)
VLDL Cholesterol Cal: 55 mg/dL — ABNORMAL HIGH (ref 5–40)

## 2020-05-22 LAB — BASIC METABOLIC PANEL
BUN/Creatinine Ratio: 12 (ref 9–23)
BUN: 11 mg/dL (ref 6–24)
CO2: 23 mmol/L (ref 20–29)
Calcium: 9.3 mg/dL (ref 8.7–10.2)
Chloride: 97 mmol/L (ref 96–106)
Creatinine, Ser: 0.93 mg/dL (ref 0.57–1.00)
Glucose: 349 mg/dL — ABNORMAL HIGH (ref 65–99)
Potassium: 4.3 mmol/L (ref 3.5–5.2)
Sodium: 138 mmol/L (ref 134–144)
eGFR: 74 mL/min/{1.73_m2} (ref >59)

## 2020-05-22 NOTE — Telephone Encounter (Signed)
Called pharmacy and provided discount card for Lantus which brought price down to $30 for 90 day supply. I followed up with patient and she is aware and this is feasible for her. No further action needed.   Thanks!

## 2020-05-22 NOTE — Telephone Encounter (Signed)
Amazing Shirley Hernandez! Thank you so much, I appreciate it and I am sure Ms Ricklefs does too! We will follow up with her in clinic next week.

## 2020-05-23 ENCOUNTER — Encounter: Payer: Self-pay | Admitting: Family Medicine

## 2020-05-28 ENCOUNTER — Ambulatory Visit: Payer: 59 | Admitting: Family Medicine

## 2020-05-28 NOTE — Progress Notes (Deleted)
    SUBJECTIVE:   CHIEF COMPLAINT / HPI:   Dm: started on lantus.  On glipizide.  Couldn't afford jardiance? Throat swelling with metformin.  Glp?  Send message to pharm.   Hld: why is she not on a statin?   PERTINENT  PMH / PSH: ***  OBJECTIVE:   There were no vitals taken for this visit.  ***  ASSESSMENT/PLAN:   No problem-specific Assessment & Plan notes found for this encounter.     Sandre Kitty, MD [facility]   {    This will disappear when note is signed, click to select method of visit    :1}

## 2020-06-03 NOTE — Progress Notes (Deleted)
     SUBJECTIVE:   CHIEF COMPLAINT / HPI:   Shirley Hernandez is a 50 y.o. female presents for diabetes follow up   Diabetes Patient's current diabetic medications include lantus, glipizide 5 mg twice daily. Seen 2 weeks ago in clinic. A1c at this time was 13. Tolerating well without side effects.  Patient endorses compliance with these medications. CBG readings averaging in the *** range.  Patient's last A1c was  Lab Results  Component Value Date   HGBA1C 13.0 (A) 05/21/2020   HGBA1C 8.4 (A) 08/24/2018   HGBA1C 7.0 01/06/2018  Denies abdominal pain, blurred vision, polyuria, polydipsia, hypoglycemia ***. Patient states they understand that diet and exercise can help with her diabetes.***.    Last Microalbumin, LDL, Creatinine: Lab Results  Component Value Date   LDLCALC 125 (H) 05/21/2020   CREATININE 0.93 05/21/2020    Flowsheet Row Office Visit from 05/21/2020 in [facility]  PHQ-9 Total Score 6       Health Maintenance Due  Topic  . Hepatitis C Screening   . PNEUMOCOCCAL POLYSACCHARIDE VACCINE AGE 3-64 HIGH RISK   . COVID-19 Vaccine (1)  . FOOT EXAM   . HIV Screening   . PAP SMEAR-Modifier   . COLONOSCOPY (Pts 45-35yrs Insurance coverage will need to be confirmed)   . OPHTHALMOLOGY EXAM   . MAMMOGRAM   . INFLUENZA VACCINE       PERTINENT  PMH / PSH: DM, migraines, HTN, obesity   OBJECTIVE:   There were no vitals taken for this visit.   General: Alert, no acute distress Cardio: Normal S1 and S2, RRR, no r/m/g Pulm: CTAB, normal work of breathing Abdomen: Bowel sounds normal. Abdomen soft and non-tender.  Extremities: No peripheral edema.  Neuro: Cranial nerves grossly intact   ASSESSMENT/PLAN:   No problem-specific Assessment & Plan notes found for this encounter.     Towanda Octave, MD PGY-2 [facility]

## 2020-06-04 ENCOUNTER — Ambulatory Visit: Payer: 59 | Admitting: Family Medicine

## 2020-06-12 ENCOUNTER — Other Ambulatory Visit: Payer: Self-pay | Admitting: Family Medicine

## 2020-08-16 ENCOUNTER — Other Ambulatory Visit: Payer: Self-pay | Admitting: Family Medicine

## 2020-08-16 DIAGNOSIS — I10 Essential (primary) hypertension: Secondary | ICD-10-CM

## 2020-08-18 ENCOUNTER — Other Ambulatory Visit: Payer: Self-pay | Admitting: Family Medicine

## 2020-08-20 NOTE — Telephone Encounter (Signed)
Patient needs follow up visit for future refills.

## 2020-08-21 NOTE — Telephone Encounter (Signed)
Called patient to schedule follow up appointment. No answer and no voicemail set up.

## 2020-08-27 ENCOUNTER — Telehealth: Payer: Self-pay | Admitting: *Deleted

## 2020-08-27 NOTE — Telephone Encounter (Signed)
Received fax from [facility] pharmacy that says pt needs a Rx for a glucose meter, strips, and lancets. Okla Qazi Zimmerman Rumple, CMA

## 2020-08-28 MED ORDER — BLOOD GLUCOSE MONITOR KIT: PACK | 0 refills | Status: DC

## 2020-08-28 NOTE — Addendum Note (Signed)
Addended by: Genia Hotter E on: 08/28/2020 12:59 PM   Modules accepted: Orders

## 2020-09-02 ENCOUNTER — Other Ambulatory Visit: Payer: Self-pay | Admitting: Family Medicine

## 2020-09-02 DIAGNOSIS — I10 Essential (primary) hypertension: Secondary | ICD-10-CM

## 2020-09-04 NOTE — Progress Notes (Deleted)
SUBJECTIVE:  CHIEF COMPLAINT / HPI:   DM Type II  Current medication regimen:  Glipizide  Started on Lantus at last visit with Dr. Allena Katz on 05/21/20, 15 units with instruction to increase by 2 units until 140.  Was on Jardiance previously, but could not afford   {SGLT2 - Jardiance -  ASCVD, DMCKD (GFR>30), HF} {GLP1 - Ozempic - ASCVD, higher A1Cs} She reports {excellent/good/fair/poor:19665} compliance with treatment. She {is/is not:21021397} having side effects. {document side effects if present:1}  {Current exercise:16438:::1}. {Current diet habits:16563:::1}.  ROS:{diabetes ros:315304::"no polyuria or polydipsia","no chest pain, dyspnea or TIA's","no numbness, tingling or pain in extremities","no unusual visual symptoms","no hypoglycemia","no medication side effects noted"} Diabetic complications include: ***  A/P  A1C today ***. Weight trend: ***.  Wt Readings from Last 3 Encounters:  05/21/20 223 lb 3.2 oz (101.2 kg)  05/31/18 219 lb (99.3 kg)  05/08/18 225 lb (102.1 kg)    Last urine microalbumin: ***   Most Recent Eye Exam: ***  Last Diabetic Foot Check:  ***  Overall, {Desc; disease control:5147}. -Medications: ***.   -Encouraged compliance with medications, and adherence to diet and exercise recommendations. -Follow-up in q*** months and bring medications to next appointment.  AVS Diabetes Your diabetes is well controlled.  We will not make any medication changes today.  Continue taking your medications, exercising and adhering to a diabetic diet  Follow up in 3 months or sooner if you are having any medication issues   diabetes is poorly controlled. Your A1C today was ***.  Medication changes: ***  If you have any issues with medications, please call before discontinuing. Please follow up in 1 month   To schedule your diabetic eye exam, please call [facility] at [phone]. They are located at: [address] [place], [place] [address]   If you would like  to speak to someone about a diabetic diet, please contact Dr Gerilyn Pilgrim for a nutrition appointment at [phone]. She is located in our clinic and available for in-person and virtual visits.    Current Outpatient Medications  Medication Instructions   ACCU-CHEK GUIDE test strip USE AS DIRECTED UP TO 4 TIMES A DAY   acetaminophen (TYLENOL) 1,000 mg, Oral, Every 6 hours PRN   albuterol (VENTOLIN HFA) 108 (90 Base) MCG/ACT inhaler 2 puffs, Inhalation, Every 4 hours PRN   amLODipine (NORVASC) 10 MG tablet TAKE 1 TABLET BY MOUTH EVERY DAY   blood glucose meter kit and supplies KIT Dispense based on patient and insurance preference. Use up to four times daily as directed.   blood glucose meter kit and supplies Dispense based on patient and insurance preference. Use up to four times daily as directed. (FOR ICD-10 E10.9, E11.9).   Blood Pressure Monitoring (BLOOD PRESSURE MONITOR AUTOMAT) DEVI 1 kit, Does not apply, Daily, Please call the office if you have any issues obtaining this monitor.   diclofenac (VOLTAREN) 75 mg, Oral, 2 times daily   empagliflozin (JARDIANCE) 10 mg, Oral, Daily   glipiZIDE (GLUCOTROL) 5 MG tablet TAKE 1 TABLET BY MOUTH TWICE A DAY BEFORE MEALS   ibuprofen (ADVIL) 600 mg, Oral, Every 8 hours PRN, Take with food.   insulin glargine (LANTUS) 15 Units, Subcutaneous, BH-each morning   medroxyPROGESTERone (DEPO-PROVERA) 150 mg, Intramuscular, Every 3 months   metoprolol succinate (TOPROL-XL) 50 mg, Oral, Daily, Take with or immediately following a meal.   mirtazapine (REMERON) 15 MG tablet TAKE 1 TABLET BY MOUTH EVERYDAY AT BEDTIME   SUMAtriptan (IMITREX) 50  MG tablet TAKE ONE TABLET BY MOUTH EVERY 2 HOURS AS NEEDED FOR MIGRAINE. MAY REPEAT IN 2 HOURS IF HEADACHE PERSISTS OR RECURS.   SUMAtriptan (IMITREX) 50 mg, Oral, Every 2 hours PRN, May repeat in 2 hours if headache persists or recurs.   triamcinolone (KENALOG) 0.025 % ointment APPLY TO AFFECTED AREA DAILY AS NEEDED   valsartan  (DIOVAN) 40 mg, Oral, Daily    PERTINENT  PMH / PSH: anxiety, HLD, DM II poorly controlled, eczema, obesity, migraines, HTN   PHQ9 SCORE ONLY 05/21/2020 02/24/2018 02/10/2018  PHQ-9 Total Score 6 0 1   OBJECTIVE:  There were no vitals taken for this visit.  General: well appearing, NAD ***   ASSESSMENT/PLAN:  No problem-specific Assessment & Plan notes found for this encounter.   DM ***   HLD  Starting rosuvastatin today   HTN  Valsartan   Health maintenance:  Colonoscopy  Mammogram  Pap smear  Vaccines    Wilber Oliphant, MD [place]   {    This will disappear when note is signed, click to select method of visit    :1}

## 2020-09-05 ENCOUNTER — Ambulatory Visit: Payer: 59 | Admitting: Family Medicine

## 2020-09-12 ENCOUNTER — Ambulatory Visit (INDEPENDENT_AMBULATORY_CARE_PROVIDER_SITE_OTHER): Payer: 59 | Admitting: Family Medicine

## 2020-09-12 ENCOUNTER — Encounter: Payer: Self-pay | Admitting: Family Medicine

## 2020-09-12 ENCOUNTER — Other Ambulatory Visit: Payer: Self-pay

## 2020-09-12 VITALS — BP 126/78 | Wt 222.8 lb

## 2020-09-12 DIAGNOSIS — E1365 Other specified diabetes mellitus with hyperglycemia: Secondary | ICD-10-CM | POA: Diagnosis not present

## 2020-09-12 DIAGNOSIS — E785 Hyperlipidemia, unspecified: Secondary | ICD-10-CM | POA: Diagnosis not present

## 2020-09-12 DIAGNOSIS — G8929 Other chronic pain: Secondary | ICD-10-CM

## 2020-09-12 DIAGNOSIS — E119 Type 2 diabetes mellitus without complications: Secondary | ICD-10-CM

## 2020-09-12 DIAGNOSIS — Z794 Long term (current) use of insulin: Secondary | ICD-10-CM

## 2020-09-12 DIAGNOSIS — M545 Low back pain, unspecified: Secondary | ICD-10-CM | POA: Diagnosis not present

## 2020-09-12 LAB — POCT GLYCOSYLATED HEMOGLOBIN (HGB A1C): HbA1c, POC (controlled diabetic range): 12.9 % — AB (ref 0.0–7.0)

## 2020-09-12 MED ORDER — ROSUVASTATIN CALCIUM 20 MG PO TABS: 20.0000 mg | ORAL_TABLET | Freq: Every day | ORAL | 3 refills | Status: DC

## 2020-09-12 NOTE — Progress Notes (Signed)
    SUBJECTIVE:   CHIEF COMPLAINT / HPI:   DM Type II  Current medication regimen:  Glipizide 5 mg, Lantus 40 units.  Patient reports that she would like to restart Jardiance.   Lost glucometer 2 weeks ago and reports not taking sugars. Prior to that, reports that sugars were 220, 245.  She reports fair compliance with treatment. She is not having side effects.   ROS:no chest pain, dyspnea or TIA's, no numbness, tingling or pain in extremities, no unusual visual symptoms, no hypoglycemia, no medication side effects noted. She reports polydipsia last week when her sugars were high   PERTINENT  PMH / PSH: HTN, low back pain, obesity, HLD, migraines, anxiety   OBJECTIVE:   BP 126/78   Wt 222 lb 12.8 oz (101.1 kg)   BMI 37.08 kg/m   Well appearing female, NAD. Accompanied by her 3 grandchildren today.  Speaking in full sentences, no respiratory distress. No BLEE.   ASSESSMENT/PLAN:   Diabetes mellitus type 2, controlled ([facility]) A1C today 12.9. Weight trend: .  Wt Readings from Last 3 Encounters:  09/12/20 222 lb 12.8 oz (101.1 kg)  05/21/20 223 lb 3.2 oz (101.2 kg)  05/31/18 219 lb (99.3 kg)    Overall, poorly controlled. Patient reports that she will be picking up her glucometer today.  She reports that she had titrated up to 40 units but reports that her sugars are still in the 200s.  I anticipate she will need much more Lantus so we will split her Lantus dose today to 20 and 20 in the a.m. and p.m.  Instructions to add 1 unit to each dose each day her sugar is greater than 150.  Patient will follow-up in 2 weeks.  Hyperlipidemia with target low density lipoprotein (LDL) cholesterol less than 100 mg/dL Patient with abnormal lipid profile at last visit.  We will start her on rosuvastatin 20 mg today.  Patient reports taking statins in the past with out any side effects.  Low back pain without sciatica Patient reports that she has been taking a lot of Motrin recently due to back  pain.  We did not have time to discuss this today but I would like her to follow-up for this soon as possible.  We will check BMP today.    Melene Plan, MD [facility]

## 2020-09-12 NOTE — Assessment & Plan Note (Signed)
A1C today 12.9. Weight trend: .  Wt Readings from Last 3 Encounters:  09/12/20 222 lb 12.8 oz (101.1 kg)  05/21/20 223 lb 3.2 oz (101.2 kg)  05/31/18 219 lb (99.3 kg)    Overall, poorly controlled. Patient reports that she will be picking up her glucometer today.  She reports that she had titrated up to 40 units but reports that her sugars are still in the 200s.  I anticipate she will need much more Lantus so we will split her Lantus dose today to 20 and 20 in the a.m. and p.m.  Instructions to add 1 unit to each dose each day her sugar is greater than 150.  Patient will follow-up in 2 weeks.

## 2020-09-12 NOTE — Patient Instructions (Signed)
  Your diabetes is poorly controlled. Your A1C today was 12.9.  Medication changes:  Get your Blood glucose monitor  as soon as possible  Start taking 20 units in the morning and 20 units at night.  Each day your sugar is more than 150, increase the insulin by 1 unit in the morning and night.  If you have any issues with medications, please call before discontinuing. Please follow up in 2 weeks   High cholesterol  Starting a medication called rosuvastatin 20 mg. Take every day and we will recheck your cholesterol in 3 months

## 2020-09-12 NOTE — Assessment & Plan Note (Signed)
Patient reports that she has been taking a lot of Motrin recently due to back pain.  We did not have time to discuss this today but I would like her to follow-up for this soon as possible.  We will check BMP today.

## 2020-09-12 NOTE — Assessment & Plan Note (Signed)
Patient with abnormal lipid profile at last visit.  We will start her on rosuvastatin 20 mg today.  Patient reports taking statins in the past with out any side effects.

## 2020-09-13 LAB — BASIC METABOLIC PANEL
BUN/Creatinine Ratio: 16 (ref 9–23)
BUN: 9 mg/dL (ref 6–24)
CO2: 22 mmol/L (ref 20–29)
Calcium: 9 mg/dL (ref 8.7–10.2)
Chloride: 100 mmol/L (ref 96–106)
Creatinine, Ser: 0.56 mg/dL — ABNORMAL LOW (ref 0.57–1.00)
Glucose: 245 mg/dL — ABNORMAL HIGH (ref 65–99)
Potassium: 4.4 mmol/L (ref 3.5–5.2)
Sodium: 136 mmol/L (ref 134–144)
eGFR: 110 mL/min/{1.73_m2} (ref >59)

## 2020-10-03 ENCOUNTER — Ambulatory Visit (INDEPENDENT_AMBULATORY_CARE_PROVIDER_SITE_OTHER): Payer: 59 | Admitting: Pharmacist

## 2020-10-03 ENCOUNTER — Other Ambulatory Visit: Payer: Self-pay

## 2020-10-03 DIAGNOSIS — Z794 Long term (current) use of insulin: Secondary | ICD-10-CM | POA: Diagnosis not present

## 2020-10-03 DIAGNOSIS — E785 Hyperlipidemia, unspecified: Secondary | ICD-10-CM | POA: Diagnosis not present

## 2020-10-03 DIAGNOSIS — E119 Type 2 diabetes mellitus without complications: Secondary | ICD-10-CM

## 2020-10-03 MED ORDER — ONETOUCH VERIO VI STRP: ORAL_STRIP | 0 refills | Status: DC

## 2020-10-03 MED ORDER — FREESTYLE LITE TEST VI STRP: ORAL_STRIP | 0 refills | Status: DC

## 2020-10-03 MED ORDER — OZEMPIC (0.25 OR 0.5 MG/DOSE) 2 MG/1.5ML ~~LOC~~ SOPN: 0.5000 mg | PEN_INJECTOR | SUBCUTANEOUS | 0 refills | Status: DC

## 2020-10-03 MED ORDER — EMPAGLIFLOZIN 10 MG PO TABS: 10.0000 mg | ORAL_TABLET | Freq: Every day | ORAL | 3 refills | Status: DC

## 2020-10-03 MED ORDER — OZEMPIC (0.25 OR 0.5 MG/DOSE) 2 MG/1.5ML ~~LOC~~ SOPN: 0.2500 mg | PEN_INJECTOR | SUBCUTANEOUS | 0 refills | Status: DC

## 2020-10-03 MED ORDER — ONETOUCH DELICA PLUS LANCET33G MISC: 0 refills | Status: DC

## 2020-10-03 MED ORDER — FREESTYLE LITE DEVI: 0 refills | Status: DC

## 2020-10-03 MED ORDER — ONETOUCH VERIO W/DEVICE KIT: PACK | 0 refills | Status: DC

## 2020-10-03 MED ORDER — FREESTYLE LANCETS MISC: 0 refills | Status: DC

## 2020-10-03 NOTE — Addendum Note (Signed)
Addended by: Cheral Almas on: 10/03/2020 02:40 PM   Modules accepted: Orders

## 2020-10-03 NOTE — Progress Notes (Signed)
Addendum: Called pharmacy and Letta Pate is not preferred - insurance prefers Freestyle. Will d/c Onetouch and send in for Freestyle.

## 2020-10-03 NOTE — Patient Instructions (Signed)
Ms. Mclees it was a pleasure seeing you today.   Please do the following:  Start Jardiance 10mg  daily and Ozempic 0.25mg  once a week as directed today during your appointment. If you have any questions or if you believe something has occurred because of this change, call me or your doctor to let one of know.  Stop glipizide 5mg  Continue checking blood sugars at home. It's really important that you record these and bring these in to your next doctor's appointment.  Continue making the lifestyle changes we've discussed together during our visit. Diet and exercise play a significant role in improving your blood sugars.  Follow-up with me on August 4th.   Hypoglycemia or low blood sugar:   Low blood sugar can happen quickly and may become an emergency if not treated right away.   While this shouldn't happen often, it can be brought upon if you skip a meal or do not eat enough. Also, if your insulin or other diabetes medications are dosed too high, this can cause your blood sugar to go to low.   Warning signs of low blood sugar include: Feeling shaky or dizzy Feeling weak or tired  Excessive hunger Feeling anxious or upset  Sweating even when you aren't exercising  What to do if I experience low blood sugar? Follow the Rule of 15 Check your blood sugar with your meter. If lower than 70, proceed to step 2.  Treat with 15 grams of fast acting carbs which is found in 3-4 glucose tablets. If none are available you can try hard candy, 1 tablespoon of sugar or honey,4 ounces of fruit juice, or 6 ounces of REGULAR soda.  Re-check your sugar in 15 minutes. If it is still below 70, do what you did in step 2 again. If your blood sugar has come back up, go ahead and eat a snack or small meal made up of complex carbs (ex. Whole grains) and protein at this time to avoid recurrence of low blood sugar.

## 2020-10-03 NOTE — Assessment & Plan Note (Signed)
ASCVD risk - primary prevention in patient with diabetes. Last LDL is not controlled. ASCVD risk score is >20%  Patient started on rosuvastatin 20mg  at last visit on 09/12/20.   1. Obtain updated lipid panel at follow-up visit 2. Continued rosuvastatin 20mg 

## 2020-10-03 NOTE — Assessment & Plan Note (Signed)
T2DM is not controlled likely due to sub-optimal dietary choices. Medication adherence appears optimal. Additional pharmacotherapy is warranted. Patient previously requested to be re-started on Jardiance but was pending BMP. Reviewed labs and patient eligible to re-start Jardiance 10mg  once daily. Will also initiate Ozempic 0.25mg  once weekly. Patient educated on purpose, proper use and potential adverse effects of Ozempic 0.25mg  and Jardiance 10mg .  Following instruction patient verbalized understanding of treatment plan.    1. Continued basal insulin glargine (Lantus) 42 units once daily. 2. Started GLP-1 semaglutide (Ozempic) 0.25mg  once weekly  3. Restarted SGLT2-I empagliflozin (Jardiance) 10mg  once daily 4. Discontinued glipizide 5mg  5. Extensively discussed pathophysiology of diabetes, dietary effects on blood sugar control, and recommended lifestyle interventions. 6. Counseled on s/sx of and management of hypoglycemia 7. Next A1C anticipated September 2022.

## 2020-10-03 NOTE — Progress Notes (Signed)
Subjective:    Patient ID: Shirley Hernandez, female    DOB: [birth year], 50 y.o.   MRN: [id number]  HPI Patient is a 50 y.o. female who presents for diabetes management. She is in good spirits and presents without assistance. Patient was referred and last seen by provider, Dr. Selena Batten, on 09/12/20.   Patient expressed many concerns during visit regarding her back pain and feeling as if her symptoms are not being treated effectively. "I need something more than motrin."  Patient reports diabetes was diagnosed in 2017.   Insurance coverage/medication affordability: [employer]  Current diabetes medications include: glipizide 5mg , insulin glargine (Lantus) 42 units once daily Current hypertension medications include: amlodipine 10mg , metoprolol succinate 50mg , valsartan 40mg  Current hyperlipidemia medications include: rosuvastatin 20mg  Patient states that She is taking her medications as prescribed. Patient reports adherence with medications.  Do you feel that your medications are working for you?  yes  Have you been experiencing any side effects to the medications prescribed? no  Do you have any problems obtaining medications due to transportation or finances?  no    Patient denies hypoglycemic events. Patient denies polyuria (increased urination).  Patient denies polyphagia (increased appetite).  Patient reports polydipsia (increased thirst).  Patient denies neuropathy (nerve pain). Patient reports visual changes but went to eye doctor in the last couple months. Patient denies self foot exams.   Patient reports that she was previously drinking a lot of soda and has since cut back and switched to mostly zero sugar soda when she is craving one.  Home fasting blood sugars: 354, 214, 224, 210, 195, 177, 199, 142, 153, 242, 240, 208, 148 2 hour post-meal/random blood sugars: 185  Objective:   Labs:   Physical Exam Neurological:     Mental Status: She is alert and oriented to person,  place, and time.    Review of Systems  Musculoskeletal:  Positive for back pain. Negative for myalgias.   Lab Results  Component Value Date   HGBA1C 12.9 (A) 09/12/2020   HGBA1C 13.0 (A) 05/21/2020   HGBA1C 8.4 (A) 08/24/2018   Lipid Panel     Component Value Date/Time   CHOL 214 (H) 05/21/2020 1455   TRIG 311 (H) 05/21/2020 1455   HDL 34 (L) 05/21/2020 1455   CHOLHDL 6.3 (H) 05/21/2020 1455   CHOLHDL 5.9 07/28/2011 0831   VLDL 25 07/28/2011 0831   LDLCALC 125 (H) 05/21/2020 1455   LDLDIRECT 113 (H) 09/20/2013 0912    Clinical Atherosclerotic Cardiovascular Disease (ASCVD): No  The 10-year ASCVD risk score 07/21/2020 DC Jr., et al., 2013) is: 29%   Values used to calculate the score:     Age: 18 years     Sex: Female     Is Non-Hispanic African American: Yes     Diabetic: Yes     Tobacco smoker: Yes     Systolic Blood Pressure: 126 mmHg     Is BP treated: Yes     HDL Cholesterol: 34 mg/dL     Total Cholesterol: 214 mg/dL   PHQ-9 Score: 1  Assessment/Plan:   T2DM is not controlled likely due to sub-optimal dietary choices. Medication adherence appears optimal. Additional pharmacotherapy is warranted. Patient previously requested to be re-started on Jardiance but was pending BMP. Reviewed labs and patient eligible to re-start Jardiance 10mg  once daily. Will also initiate Ozempic 0.25mg  once weekly. Patient educated on purpose, proper use and potential adverse effects of Ozempic 0.25mg  and Jardiance 10mg .  Following instruction patient verbalized  understanding of treatment plan.    Continued basal insulin glargine (Lantus) 42 units once daily. Started GLP-1 semaglutide (Ozempic) 0.25mg  once weekly  Restarted SGLT2-I empagliflozin (Jardiance) 10mg  once daily Discontinued glipizide 5mg  Extensively discussed pathophysiology of diabetes, dietary effects on blood sugar control, and recommended lifestyle interventions. Counseled on s/sx of and management of hypoglycemia Next A1C  anticipated September 2022.   ASCVD risk - primary prevention in patient with diabetes. Last LDL is not controlled. ASCVD risk score is >20%  Patient started on rosuvastatin 20mg  at last visit on 09/12/20.   Obtain updated lipid panel at follow-up visit Continued rosuvastatin 20mg   Low back pain - will route message to PCP to discuss further with patient  Follow-up appointment 3 weeks to review sugar readings. Written patient instructions provided.  This appointment required 40 minutes of direct patient care.  Thank you for involving pharmacy to assist in providing this patient's care.

## 2020-10-20 ENCOUNTER — Other Ambulatory Visit: Payer: Self-pay | Admitting: Family Medicine

## 2020-10-20 DIAGNOSIS — G5603 Carpal tunnel syndrome, bilateral upper limbs: Secondary | ICD-10-CM

## 2020-10-22 ENCOUNTER — Other Ambulatory Visit: Payer: Self-pay

## 2020-10-22 NOTE — Telephone Encounter (Signed)
Opened in error

## 2020-10-24 ENCOUNTER — Ambulatory Visit (INDEPENDENT_AMBULATORY_CARE_PROVIDER_SITE_OTHER): Payer: 59 | Admitting: Pharmacist

## 2020-10-24 ENCOUNTER — Other Ambulatory Visit: Payer: Self-pay

## 2020-10-24 DIAGNOSIS — E119 Type 2 diabetes mellitus without complications: Secondary | ICD-10-CM | POA: Diagnosis not present

## 2020-10-24 DIAGNOSIS — Z794 Long term (current) use of insulin: Secondary | ICD-10-CM

## 2020-10-24 NOTE — Progress Notes (Signed)
Subjective:    Patient ID: Shirley Hernandez, female    DOB: 1971/02/19, 50 y.o.   MRN: [id number]  HPI Patient is a 50 y.o. female who presents for diabetes management. She is in good spirits and presents without assistance. Patient was referred and last seen by provider, Dr. Selena Batten, on 09/12/20.  Last seen in pharmacy clinic on 10/03/20.  Patient reports her back is still bothering her and that she experienced her menstrual cycle recently which lead to worsened pain. She reports taking 4 tablets of OTC 200mg  ibuprofen without relief. She has not picked up the prescription yet that was sent in on Monday.  Patient reports diabetes was diagnosed in 2017.   Insurance coverage/medication affordabiity: [employer]  Current diabetes medications include: insulin glargine (Lantus) 42 units once daily, ozempic 0.25mg  once weekly on tuesdays (completed 2 doses), Jardiance 10mg   Current hypertension medications include: amlodipine 10mg , metoprolol succinate 50mg , valsartan 40mg  Current hyperlipidemia medications include: rosuvastatin 20mg  Patient states that She is taking her medications as prescribed. Patient reports adherence with medications.  Do you feel that your medications are working for you?  yes  Have you been experiencing any side effects to the medications prescribed? no  Do you have any problems obtaining medications due to transportation or finances?  no    Patient denies hypoglycemic events. Patient denies polyuria (increased urination).  Patient denies polyphagia (increased appetite).  Patient reports polydipsia (increased thirst).  Patient denies neuropathy (nerve pain). Patient denies visual changes. Patient denies self foot exams.   Patient reports that she was previously drinking a lot of soda and has since cut back and switched to mostly zero sugar soda when she is craving one.  Home fasting blood sugars: 120's; highest 158  Objective:   Labs:   Physical Exam Neurological:      Mental Status: She is alert and oriented to person, place, and time.    Review of Systems  Gastrointestinal:  Negative for nausea and vomiting.  Musculoskeletal:  Positive for back pain. Negative for myalgias.   Lab Results  Component Value Date   HGBA1C 12.9 (A) 09/12/2020   HGBA1C 13.0 (A) 05/21/2020   HGBA1C 8.4 (A) 08/24/2018   Lipid Panel     Component Value Date/Time   CHOL 214 (H) 05/21/2020 1455   TRIG 311 (H) 05/21/2020 1455   HDL 34 (L) 05/21/2020 1455   CHOLHDL 6.3 (H) 05/21/2020 1455   CHOLHDL 5.9 07/28/2011 0831   VLDL 25 07/28/2011 0831   LDLCALC 125 (H) 05/21/2020 1455   LDLDIRECT 113 (H) 09/20/2013 0912    Clinical Atherosclerotic Cardiovascular Disease (ASCVD): No  The 10-year ASCVD risk score 07/21/2020 DC Jr., et al., 2013) is: 29%   Values used to calculate the score:     Age: 20 years     Sex: Female     Is Non-Hispanic African American: Yes     Diabetic: Yes     Tobacco smoker: Yes     Systolic Blood Pressure: 126 mmHg     Is BP treated: Yes     HDL Cholesterol: 34 mg/dL     Total Cholesterol: 214 mg/dL   Assessment/Plan:   09/27/2011 control is improving likely due to additional medication therapy initiated at last appointment. Medication adherence appears optimal. Will have patient titrate up Ozempic to 0.5mg  starting in two weeks. Following instruction patient verbalized understanding of treatment plan.    Continued basal insulin glargine (Lantus) 42 units once daily. Continued GLP-1 semaglutide (Ozempic) 0.25mg   once weekly and titrate up after completing 4th dose next Tuesday Continued SGLT2-I empagliflozin (Jardiance) 10mg  once daily Extensively discussed pathophysiology of diabetes, dietary effects on blood sugar control, and recommended lifestyle interventions. Counseled on s/sx of and management of hypoglycemia Next A1C anticipated September 2022.   ASCVD risk - primary prevention in patient with diabetes. Last LDL is not controlled. ASCVD  risk score is >20%  Patient started on rosuvastatin 20mg  at last visit on 09/12/20.   Will plan to obtain updated lipid panel in September when patient obtains A1C Continued rosuvastatin 20mg   Follow-up appointment 3 weeks to review sugar readings. Written patient instructions provided.  This appointment required 25 minutes of direct patient care.  Thank you for involving pharmacy to assist in providing this patient's care.

## 2020-10-24 NOTE — Patient Instructions (Signed)
Ms. Monfort it was a pleasure seeing you today.   Please do the following:  Take Ozempic 0.25mg  next Tuesday the 9th and then on the following Tuesday the 16th we will start having you take 0.5mg   as directed today during your appointment. If you have any questions or if you believe something has occurred because of this change, call me or your doctor to let one of [place] know.  Continue checking blood sugars at home. It's really important that you record these and bring these in to your next doctor's appointment.  Continue making the lifestyle changes we've discussed together during our visit. Diet and exercise play a significant role in improving your blood sugars.  Follow-up with me in 3-4 weeks.  Hypoglycemia or low blood sugar:   Low blood sugar can happen quickly and may become an emergency if not treated right away.   While this shouldn't happen often, it can be brought upon if you skip a meal or do not eat enough. Also, if your insulin or other diabetes medications are dosed too high, this can cause your blood sugar to go to low.   Warning signs of low blood sugar include: Feeling shaky or dizzy Feeling weak or tired  Excessive hunger Feeling anxious or upset  Sweating even when you aren't exercising  What to do if I experience low blood sugar? Follow the Rule of 15 Check your blood sugar with your meter. If lower than 70, proceed to step 2.  Treat with 15 grams of fast acting carbs which is found in 3-4 glucose tablets. If none are available you can try hard candy, 1 tablespoon of sugar or honey,4 ounces of fruit juice, or 6 ounces of REGULAR soda.  Re-check your sugar in 15 minutes. If it is still below 70, do what you did in step 2 again. If your blood sugar has come back up, go ahead and eat a snack or small meal made up of complex carbs (ex. Whole grains) and protein at this time to avoid recurrence of low blood sugar.

## 2020-10-24 NOTE — Assessment & Plan Note (Signed)
T2DM control is improving likely due to additional medication therapy initiated at last appointment. Medication adherence appears optimal. Will have patient titrate up Ozempic to 0.5mg  starting in two weeks. Following instruction patient verbalized understanding of treatment plan.    1. Continued basal insulin glargine (Lantus) 42 units once daily. 2. Continued GLP-1 semaglutide (Ozempic) 0.25mg  once weekly and titrate up after completing 4th dose next Tuesday 3. Continued SGLT2-I empagliflozin (Jardiance) 10mg  once daily 4. Extensively discussed pathophysiology of diabetes, dietary effects on blood sugar control, and recommended lifestyle interventions. 5. Counseled on s/sx of and management of hypoglycemia 6. Next A1C anticipated September 2022.

## 2020-10-27 ENCOUNTER — Other Ambulatory Visit: Payer: Self-pay | Admitting: Family Medicine

## 2020-10-27 DIAGNOSIS — Z794 Long term (current) use of insulin: Secondary | ICD-10-CM

## 2020-10-27 DIAGNOSIS — E119 Type 2 diabetes mellitus without complications: Secondary | ICD-10-CM

## 2020-11-04 ENCOUNTER — Ambulatory Visit (INDEPENDENT_AMBULATORY_CARE_PROVIDER_SITE_OTHER): Payer: 59 | Admitting: Family Medicine

## 2020-11-04 DIAGNOSIS — Z5329 Procedure and treatment not carried out because of patient's decision for other reasons: Secondary | ICD-10-CM

## 2020-11-04 NOTE — Progress Notes (Signed)
No show

## 2020-11-19 ENCOUNTER — Other Ambulatory Visit: Payer: Self-pay | Admitting: Family Medicine

## 2020-11-19 ENCOUNTER — Telehealth: Payer: Self-pay

## 2020-11-19 DIAGNOSIS — I10 Essential (primary) hypertension: Secondary | ICD-10-CM

## 2020-11-19 DIAGNOSIS — G5603 Carpal tunnel syndrome, bilateral upper limbs: Secondary | ICD-10-CM

## 2020-11-19 NOTE — Telephone Encounter (Signed)
Patient calls nurse line stating she went to pick up Lantus and Ozempic, however the cost together was ~500 dollars. I called the pharmacy to get more information. The Ozempic is too soon to fill therefore the cost was much higher than usual, they will contact her when appropriate for pick up. The Lantus is no longer covered by her insurance. I called her insurance and  they prefer insulin glargine. This does require a PA, however will be 0 dollar copay for patient if approved.   Please send in if appropriate and I will do the PA.

## 2020-11-20 ENCOUNTER — Other Ambulatory Visit: Payer: Self-pay | Admitting: Family Medicine

## 2020-11-20 MED ORDER — INSULIN GLARGINE 100 UNIT/ML ~~LOC~~ SOLN: 42.0000 [IU] | Freq: Every day | SUBCUTANEOUS | 3 refills | Status: DC

## 2020-11-20 NOTE — Telephone Encounter (Signed)
I have sent glargine 42 units to the pharmacy. Please let pt know. Thank you.

## 2021-04-01 ENCOUNTER — Telehealth: Payer: Self-pay

## 2021-04-01 DIAGNOSIS — E1365 Other specified diabetes mellitus with hyperglycemia: Secondary | ICD-10-CM

## 2021-04-01 NOTE — Telephone Encounter (Signed)
Patient calls nurse line regarding lantus not being covered by her new insurance [employer]). Called pharmacy. Pharmacist reports that name brand is not covered and generic is $100 per vial.   Pharmacist states that claim does not give insurance preferred alternatives.   Will forward to pharmacy team for further recommendations.   Veronda Prude, RN

## 2021-04-02 MED ORDER — CONTOUR NEXT ONE KIT: PACK | 0 refills | Status: AC

## 2021-04-02 MED ORDER — LANCET DEVICE MISC: 12 refills | Status: AC

## 2021-04-02 MED ORDER — CONTOUR NEXT TEST VI STRP: ORAL_STRIP | 12 refills | Status: DC

## 2021-04-02 MED ORDER — INSULIN DEGLUDEC 100 UNIT/ML ~~LOC~~ SOPN: 42.0000 [IU] | PEN_INJECTOR | Freq: Every day | SUBCUTANEOUS | 2 refills | Status: DC

## 2021-04-02 NOTE — Telephone Encounter (Signed)
Reviewed and agree.

## 2021-04-03 NOTE — Telephone Encounter (Signed)
Noted and agreed. Thank you Rachelle!

## 2021-04-10 ENCOUNTER — Ambulatory Visit: Payer: Self-pay | Admitting: Pharmacist

## 2022-01-12 ENCOUNTER — Other Ambulatory Visit: Payer: Self-pay | Admitting: Family Medicine

## 2022-01-12 DIAGNOSIS — E119 Type 2 diabetes mellitus without complications: Secondary | ICD-10-CM

## 2022-01-13 ENCOUNTER — Other Ambulatory Visit: Payer: Self-pay

## 2022-01-13 DIAGNOSIS — I10 Essential (primary) hypertension: Secondary | ICD-10-CM

## 2022-01-13 MED ORDER — AMLODIPINE BESYLATE 10 MG PO TABS: 10.0000 mg | ORAL_TABLET | Freq: Every day | ORAL | 0 refills | Status: DC

## 2022-01-13 NOTE — Telephone Encounter (Signed)
Received fax from [facility] asking for a refill of the following medication not found on med list.  Glipizide 5 MG tab Take 1 tab by mouth twice a day before meals.  Please refill if appropriate.  Ottis Stain, CMA

## 2022-01-14 ENCOUNTER — Ambulatory Visit: Payer: Self-pay | Admitting: Student

## 2022-05-18 ENCOUNTER — Other Ambulatory Visit: Payer: Self-pay | Admitting: Family Medicine

## 2022-05-18 DIAGNOSIS — L309 Dermatitis, unspecified: Secondary | ICD-10-CM

## 2022-05-18 DIAGNOSIS — I10 Essential (primary) hypertension: Secondary | ICD-10-CM

## 2022-05-18 DIAGNOSIS — Z794 Long term (current) use of insulin: Secondary | ICD-10-CM

## 2022-05-18 DIAGNOSIS — E1365 Other specified diabetes mellitus with hyperglycemia: Secondary | ICD-10-CM

## 2022-05-22 NOTE — Telephone Encounter (Signed)
Pt has appt in [facility] on Monday 05/25/22.  Wants to know if she can have refills on insulin and HTN meds till then. Christen Bame, CMA

## 2022-05-25 ENCOUNTER — Other Ambulatory Visit: Payer: Self-pay | Admitting: Student

## 2022-05-25 ENCOUNTER — Other Ambulatory Visit: Payer: Self-pay

## 2022-05-25 ENCOUNTER — Ambulatory Visit (INDEPENDENT_AMBULATORY_CARE_PROVIDER_SITE_OTHER): Payer: BC Managed Care – PPO | Admitting: Family Medicine

## 2022-05-25 VITALS — BP 154/115 | HR 96 | Ht 65.0 in | Wt 212.6 lb

## 2022-05-25 DIAGNOSIS — E1365 Other specified diabetes mellitus with hyperglycemia: Secondary | ICD-10-CM

## 2022-05-25 DIAGNOSIS — E785 Hyperlipidemia, unspecified: Secondary | ICD-10-CM

## 2022-05-25 DIAGNOSIS — I10 Essential (primary) hypertension: Secondary | ICD-10-CM

## 2022-05-25 DIAGNOSIS — Z794 Long term (current) use of insulin: Secondary | ICD-10-CM

## 2022-05-25 DIAGNOSIS — G5603 Carpal tunnel syndrome, bilateral upper limbs: Secondary | ICD-10-CM

## 2022-05-25 DIAGNOSIS — F419 Anxiety disorder, unspecified: Secondary | ICD-10-CM

## 2022-05-25 DIAGNOSIS — E1165 Type 2 diabetes mellitus with hyperglycemia: Secondary | ICD-10-CM

## 2022-05-25 DIAGNOSIS — L309 Dermatitis, unspecified: Secondary | ICD-10-CM

## 2022-05-25 LAB — POCT GLYCOSYLATED HEMOGLOBIN (HGB A1C): HbA1c, POC (controlled diabetic range): 12.6 % — AB (ref 0.0–7.0)

## 2022-05-25 MED ORDER — MIRTAZAPINE 15 MG PO TABS: 15.0000 mg | ORAL_TABLET | Freq: Every day | ORAL | 1 refills | Status: DC

## 2022-05-25 MED ORDER — VALSARTAN 40 MG PO TABS: 40.0000 mg | ORAL_TABLET | Freq: Every day | ORAL | 0 refills | Status: DC

## 2022-05-25 MED ORDER — TRIAMCINOLONE ACETONIDE 0.025 % EX OINT: TOPICAL_OINTMENT | CUTANEOUS | 0 refills | Status: AC

## 2022-05-25 MED ORDER — ALBUTEROL SULFATE HFA 108 (90 BASE) MCG/ACT IN AERS: 2.0000 | INHALATION_SPRAY | RESPIRATORY_TRACT | 0 refills | Status: AC | PRN

## 2022-05-25 MED ORDER — INSULIN DEGLUDEC 100 UNIT/ML ~~LOC~~ SOPN: 42.0000 [IU] | PEN_INJECTOR | Freq: Every day | SUBCUTANEOUS | 2 refills | Status: DC

## 2022-05-25 MED ORDER — AMLODIPINE BESYLATE 10 MG PO TABS: 10.0000 mg | ORAL_TABLET | Freq: Every day | ORAL | 0 refills | Status: DC

## 2022-05-25 MED ORDER — ATORVASTATIN CALCIUM 20 MG PO TABS: 20.0000 mg | ORAL_TABLET | Freq: Every day | ORAL | 3 refills | Status: DC

## 2022-05-25 MED ORDER — METOPROLOL SUCCINATE ER 50 MG PO TB24: 50.0000 mg | ORAL_TABLET | Freq: Every day | ORAL | 3 refills | Status: DC

## 2022-05-25 NOTE — Assessment & Plan Note (Signed)
Uncontrolled secondary to medication nonadherence.  Out of medications for months. - restart Tresiba 42 units daily for now - continue monitoring CBG - hold empagliflozin for now - follow-up in pharmacy clinic in 2 weeks for insulin titration

## 2022-05-25 NOTE — Progress Notes (Signed)
SUBJECTIVE:   CHIEF COMPLAINT / HPI:  Chief Complaint  Patient presents with   Diabetes   Medication Refill   Vaginitis    Last OV in 10/2020 with clinical pharmacist for diabetes. At that time was taking Lantus 42 units daily, Ozempic 0.25 mg weekly, empagliflozin 10 mg. Fasting sugars: mid 200s-400s  Ran out of all of her medications months ago  Vaginal itching for 2 days, thinks she has a yeast infection  Using albuterol twice a week. Coughing attacks since she had Covid.  PERTINENT  PMH / PSH: T2DM  Patient Care Team: Orvis Brill, DO as PCP - General (Family Medicine)   OBJECTIVE:   BP (!) 154/115   Pulse 96   Ht '5\' 5"'$  (1.651 m)   Wt 212 lb 9.6 oz (96.4 kg)   LMP  (LMP Unknown)   SpO2 100%   BMI 35.38 kg/m   Physical Exam Constitutional:      General: She is not in acute distress.    Appearance: She is obese.  HENT:     Head: Normocephalic and atraumatic.  Cardiovascular:     Rate and Rhythm: Normal rate and regular rhythm.  Pulmonary:     Effort: Pulmonary effort is normal. No respiratory distress.     Breath sounds: Normal breath sounds.  Musculoskeletal:     Cervical back: Neck supple.  Neurological:     Mental Status: She is alert.         05/25/2022    9:56 AM  Depression screen PHQ 2/9  Decreased Interest 3  Down, Depressed, Hopeless 0  PHQ - 2 Score 3  Altered sleeping 0  Tired, decreased energy 3  Change in appetite 0  Feeling bad or failure about yourself  0  Trouble concentrating 0  Moving slowly or fidgety/restless 0  Suicidal thoughts 0  PHQ-9 Score 6        Last hemoglobin A1c Lab Results  Component Value Date   HGBA1C 12.6 (A) 05/25/2022      ASSESSMENT/PLAN:   Problem List Items Addressed This Visit       Cardiovascular and Mediastinum   Essential hypertension (Chronic)    Uncontrolled secondary to medication nonadherence.  Restart amlodipine, valsartan, and metoprolol succinate.  Refilled by PCP today.       Relevant Medications   atorvastatin (LIPITOR) 20 MG tablet     Endocrine   Type 2 diabetes mellitus ([place])    Uncontrolled secondary to medication nonadherence.  Out of medications for months. - restart Tresiba 42 units daily for now - continue monitoring CBG - hold empagliflozin for now - follow-up in pharmacy clinic in 2 weeks for insulin titration      Relevant Medications   atorvastatin (LIPITOR) 20 MG tablet     Musculoskeletal and Integument   Eczema (Chronic)   Relevant Medications   triamcinolone (KENALOG) 0.025 % ointment     Other   Hyperlipidemia with target low density lipoprotein (LDL) cholesterol less than 100 mg/dL - Primary    Restart statin      Relevant Medications   atorvastatin (LIPITOR) 20 MG tablet   Other Relevant Orders   Lipid Panel   Anxiety   Relevant Medications   mirtazapine (REMERON) 15 MG tablet   Other Visit Diagnoses     Uncontrolled other specified diabetes mellitus with hyperglycemia (HCC)       Relevant Medications   atorvastatin (LIPITOR) 20 MG tablet   Other Relevant Orders   HgB  A1c (Completed)   Basic Metabolic Panel   Microalbumin/Creatinine Ratio, Urine         Advised to follow-up for vaginal itching  Return in about 3 months (around 08/25/2022) for f/u diabetes.  Follow-up in 2 weeks with pharmacy clinic for diabetes management and insulin titration  Zola Button, MD [place]

## 2022-05-25 NOTE — Assessment & Plan Note (Addendum)
Uncontrolled secondary to medication nonadherence.  Restart amlodipine, valsartan, and metoprolol succinate.  Refilled by PCP today.

## 2022-05-25 NOTE — Assessment & Plan Note (Signed)
Restart statin  

## 2022-05-25 NOTE — Patient Instructions (Addendum)
It was nice seeing you today!  Schedule another visit for your vaginal itching.  Don't restart the Jardiance for now.  Start taking the insulin 42 units once a day.  See Dr. Valentina Lucks in the pharmacy clinic in the next 1-2 weeks.  Stay well, Shirley Button, MD [employer] [phone]  --  Make sure to check out at the front desk before you leave today.  Please arrive at least 15 minutes prior to your scheduled appointments.  If you had blood work today, I will send you a MyChart message or a letter if results are normal. Otherwise, I will give you a call.  If you had a referral placed, they will call you to set up an appointment. Please give [place] a call if you don't hear back in the next 2 weeks.  If you need additional refills before your next appointment, please call your pharmacy first.

## 2022-05-26 ENCOUNTER — Encounter: Payer: Self-pay | Admitting: Family Medicine

## 2022-05-26 LAB — MICROALBUMIN / CREATININE URINE RATIO
Creatinine, Urine: 97.5 mg/dL
Microalb/Creat Ratio: 48 mg/g creat — ABNORMAL HIGH (ref 0–29)
Microalbumin, Urine: 46.5 ug/mL

## 2022-05-26 LAB — BASIC METABOLIC PANEL
BUN/Creatinine Ratio: 15 (ref 9–23)
BUN: 11 mg/dL (ref 6–24)
CO2: 22 mmol/L (ref 20–29)
Calcium: 9.3 mg/dL (ref 8.7–10.2)
Chloride: 100 mmol/L (ref 96–106)
Creatinine, Ser: 0.72 mg/dL (ref 0.57–1.00)
Glucose: 253 mg/dL — ABNORMAL HIGH (ref 70–99)
Potassium: 4.4 mmol/L (ref 3.5–5.2)
Sodium: 139 mmol/L (ref 134–144)
eGFR: 100 mL/min/{1.73_m2} (ref >59)

## 2022-05-26 LAB — LIPID PANEL
Chol/HDL Ratio: 5.3 ratio — ABNORMAL HIGH (ref 0.0–4.4)
Cholesterol, Total: 200 mg/dL — ABNORMAL HIGH (ref 100–199)
HDL: 38 mg/dL — ABNORMAL LOW (ref >39)
LDL Chol Calc (NIH): 130 mg/dL — ABNORMAL HIGH (ref 0–99)
Triglycerides: 181 mg/dL — ABNORMAL HIGH (ref 0–149)
VLDL Cholesterol Cal: 32 mg/dL (ref 5–40)

## 2022-05-27 ENCOUNTER — Other Ambulatory Visit (HOSPITAL_COMMUNITY): Payer: Self-pay

## 2022-06-03 ENCOUNTER — Other Ambulatory Visit (HOSPITAL_COMMUNITY): Payer: Self-pay

## 2022-06-16 ENCOUNTER — Other Ambulatory Visit: Payer: Self-pay | Admitting: Family Medicine

## 2022-06-16 DIAGNOSIS — F419 Anxiety disorder, unspecified: Secondary | ICD-10-CM

## 2022-07-17 ENCOUNTER — Other Ambulatory Visit: Payer: Self-pay | Admitting: Student

## 2022-07-17 ENCOUNTER — Ambulatory Visit (INDEPENDENT_AMBULATORY_CARE_PROVIDER_SITE_OTHER): Payer: Commercial Managed Care - HMO | Admitting: Student

## 2022-07-17 ENCOUNTER — Encounter: Payer: Self-pay | Admitting: Student

## 2022-07-17 VITALS — BP 134/78 | HR 95 | Ht 65.0 in | Wt 214.0 lb

## 2022-07-17 DIAGNOSIS — R300 Dysuria: Secondary | ICD-10-CM | POA: Diagnosis not present

## 2022-07-17 DIAGNOSIS — Z794 Long term (current) use of insulin: Secondary | ICD-10-CM

## 2022-07-17 DIAGNOSIS — E1165 Type 2 diabetes mellitus with hyperglycemia: Secondary | ICD-10-CM

## 2022-07-17 DIAGNOSIS — N898 Other specified noninflammatory disorders of vagina: Secondary | ICD-10-CM

## 2022-07-17 DIAGNOSIS — N950 Postmenopausal bleeding: Secondary | ICD-10-CM | POA: Diagnosis not present

## 2022-07-17 LAB — POCT UA - MICROSCOPIC ONLY
Epithelial cells, urine per micros: >20
RBC, Urine, Miroscopic: >20 (ref 0–2)
Trichomonas, UA: POSITIVE
WBC, Ur, HPF, POC: NONE SEEN (ref 0–5)

## 2022-07-17 LAB — POCT URINALYSIS DIP (MANUAL ENTRY)
Bilirubin, UA: NEGATIVE
Glucose, UA: >=1000 mg/dL — AB
Leukocytes, UA: NEGATIVE
Nitrite, UA: NEGATIVE
Protein Ur, POC: NEGATIVE mg/dL
Spec Grav, UA: 1.015 (ref 1.010–1.025)
Urobilinogen, UA: 0.2 E.U./dL
pH, UA: 5 (ref 5.0–8.0)

## 2022-07-17 LAB — POCT WET PREP (WET MOUNT)
Clue Cells Wet Prep Whiff POC: NEGATIVE
Trichomonas Wet Prep HPF POC: ABSENT

## 2022-07-17 MED ORDER — FLUCONAZOLE 150 MG PO TABS: 150.0000 mg | ORAL_TABLET | Freq: Once | ORAL | 0 refills | Status: DC

## 2022-07-17 MED ORDER — FLUCONAZOLE 150 MG PO TABS: 150.0000 mg | ORAL_TABLET | Freq: Once | ORAL | 0 refills | Status: AC

## 2022-07-17 NOTE — Progress Notes (Signed)
SUBJECTIVE:   CHIEF COMPLAINT / HPI:   Yeast Infection Has been having symptoms more frequently since sugars have been high. Has been having burning and itching after wiping. Has been having yeast infections for months. Was last treated for a yeast infection in February. Denies any vaginal discharge.  Is also appreciating dysuria, that started last night, after itching/washing self. Reports sugars have been out of control since switching insurance>agents based on what's covered.   Post menopausal vaginal bleeding Used to be on Depot for uncontrolled vaginal bleeding/ irregular periods. But got off of it. Came of and had two regular cycles, but then stopped having periods for 4-5 years. Has had spotting for last year every few months or so, but no full cycle.   DM Meds: Tresiba 50 units daily  CBG range: 390-490 range Notes she is peeing a lot and eyes are blurry in am.    HTN BP at goal today Meds: amlodipine 10 mg, valsartan 40 mg, and metoprolol succinate 50 mg  HM DM eye exam Mammogram Colonoscopy Pap smear  PERTINENT  PMH / PSH: HTN, DM  Patient Care Team: Darral Dash, DO as PCP - General (Family Medicine) OBJECTIVE:  BP 134/78   Pulse 95   Ht 5\' 5"  (1.651 m)   Wt 214 lb (97.1 kg)   LMP  (LMP Unknown)   SpO2 99%   BMI 35.61 kg/m  Physical Exam Exam conducted with a chaperone present.  Constitutional:      General: She is not in acute distress.    Appearance: Normal appearance. She is not ill-appearing or toxic-appearing.  Cardiovascular:     Rate and Rhythm: Normal rate and regular rhythm.     Heart sounds: No murmur heard.    No friction rub. No gallop.  Pulmonary:     Effort: Pulmonary effort is normal. No respiratory distress.     Breath sounds: Normal breath sounds. No stridor. No wheezing, rhonchi or rales.  Genitourinary:    Labia:        Left: Lesion present.      Vagina: Vaginal discharge and bleeding present. No erythema or tenderness.      Cervix: Dilated. Cervical bleeding present. No friability or erythema.       Comments: Large wart-like lesion on inferior portion of left labia Neurological:     Mental Status: She is alert.      ASSESSMENT/PLAN:  Vaginal itching Assessment & Plan: Patient notes recent vaginal itching/burning after rubbing vulva raw. DM has been uncontrolled since she her meds switched for insurance reasons. Patient also noticing some dysuria, but notes this was after vulva was rubbed raw. Wet prep positive for yeast.  -Diflucan x1, then 72 hours later x 1 -UA  Orders: -     POCT Wet Prep [employer] [place])  Type 2 diabetes mellitus with hyperglycemia, with long-term current use of insulin (HCC) Assessment & Plan: Patient sugars have been poorly controlled witch cbg 390-490 range and needing up trituration of insulin. Patient elevated sugars likely causing yeast infection. Patient appreciating increased urination and blurry vision in the mornings.  -Increase Tresiba to 60 units daily -F/u w/ Dr. Paulino Rily for management of CBGs  Orders: -     Ambulatory referral to Ophthalmology  Post-menopausal bleeding Assessment & Plan: Patient w/ hx of irregular cycles, previously on depot injections, had discontinued and had 2 normal periods, followed by 4-5 years of no bleeding. Patient note's that she's been having spotting irregularly for the last year,  off and on. Concern for endometrial cancer will obtain imaging and endometrial biopsy.  -Endometrial biopsy in colpo clinic -Transvaginal US  Orders: -     US PELVIC COMPLETE WITH TRANSVAGINAL; Future  Dysuria -     POCT UA - Microscopic Only -     Urine Culture -     POCT urinalysis dipstick  Other orders -     Fluconazole; Take 1 tablet (150 mg total) by mouth once for 1 dose. Take second pill 3 days after the first pill  Dispense: 2 tablet; Refill: 0   No follow-ups on file. Bess Kinds, MD 07/19/2022, 8:49 AM PGY-2, [facility]

## 2022-07-17 NOTE — Patient Instructions (Signed)
It was great to see you! Thank you for allowing me to participate in your care!   Today we are testing you for a yeast infection. We will adjust your insulin and have you follow up. Please make an appointment for a check up.   Our plans for today:  - Diabetes  Increases Tresiba to 60 units daily  Schedule appointment w/ Dr. Paulino Rily for Diabetes Management - Post-menopausal bleeding Your bleeding is concerning for endometrial cancer. We will get a biopsy of your endometrium and an ultrasound Your biopsy will be done 07/23/22 at 10:40 am Someone will call to set up ultrasound - Vaginal itching / pain with urination  We are testing you for a yeast infection and Urinary tract infection  I will send in medicine if needed  -Check Up  Make an appointment for a check up, can be with me. You are due.   Dr. Bess Kinds  We are checking some labs today, I will call you if they are abnormal will send you a MyChart message or a letter if they are normal.  If you do not hear about your labs in the next 2 weeks please let us know.  Take care and seek immediate care sooner if you develop any concerns.   Dr. Bess Kinds, MD [facility]

## 2022-07-19 DIAGNOSIS — N950 Postmenopausal bleeding: Secondary | ICD-10-CM | POA: Insufficient documentation

## 2022-07-19 DIAGNOSIS — N898 Other specified noninflammatory disorders of vagina: Secondary | ICD-10-CM | POA: Insufficient documentation

## 2022-07-19 DIAGNOSIS — N939 Abnormal uterine and vaginal bleeding, unspecified: Secondary | ICD-10-CM | POA: Insufficient documentation

## 2022-07-19 NOTE — Assessment & Plan Note (Signed)
Patient notes recent vaginal itching/burning after rubbing vulva raw. DM has been uncontrolled since she her meds switched for insurance reasons. Patient also noticing some dysuria, but notes this was after vulva was rubbed raw. Wet prep positive for yeast.  -Diflucan x1, then 72 hours later x 1 -UA

## 2022-07-19 NOTE — Assessment & Plan Note (Addendum)
Patient sugars have been poorly controlled witch cbg 390-490 range and needing up trituration of insulin. Patient elevated sugars likely causing yeast infection. Patient appreciating increased urination and blurry vision in the mornings.  -Increase Tresiba to 60 units daily -F/u w/ Dr. Paulino Rily for management of CBGs

## 2022-07-19 NOTE — Assessment & Plan Note (Signed)
Patient w/ hx of irregular cycles, previously on depot injections, had discontinued and had 2 normal periods, followed by 4-5 years of no bleeding. Patient note's that she's been having spotting irregularly for the last year, off and on. Concern for endometrial cancer will obtain imaging and endometrial biopsy.  -Endometrial biopsy in colpo clinic -Transvaginal US

## 2022-07-20 LAB — URINE CULTURE

## 2022-07-21 ENCOUNTER — Ambulatory Visit (INDEPENDENT_AMBULATORY_CARE_PROVIDER_SITE_OTHER): Payer: Commercial Managed Care - HMO | Admitting: Pharmacist

## 2022-07-21 ENCOUNTER — Encounter: Payer: Self-pay | Admitting: Pharmacist

## 2022-07-21 VITALS — BP 146/93 | HR 111 | Wt 213.6 lb

## 2022-07-21 DIAGNOSIS — I10 Essential (primary) hypertension: Secondary | ICD-10-CM

## 2022-07-21 DIAGNOSIS — F17200 Nicotine dependence, unspecified, uncomplicated: Secondary | ICD-10-CM

## 2022-07-21 DIAGNOSIS — Z794 Long term (current) use of insulin: Secondary | ICD-10-CM

## 2022-07-21 DIAGNOSIS — G5603 Carpal tunnel syndrome, bilateral upper limbs: Secondary | ICD-10-CM | POA: Diagnosis not present

## 2022-07-21 DIAGNOSIS — E1165 Type 2 diabetes mellitus with hyperglycemia: Secondary | ICD-10-CM

## 2022-07-21 DIAGNOSIS — E1365 Other specified diabetes mellitus with hyperglycemia: Secondary | ICD-10-CM

## 2022-07-21 MED ORDER — SEMAGLUTIDE(0.25 OR 0.5MG/DOS) 2 MG/3ML ~~LOC~~ SOPN
0.5000 mg | PEN_INJECTOR | SUBCUTANEOUS | 11 refills | Status: DC
Start: 2022-07-21 — End: 2023-08-03

## 2022-07-21 MED ORDER — IBUPROFEN 600 MG PO TABS
ORAL_TABLET | ORAL | 0 refills | Status: DC
Start: 2022-07-21 — End: 2022-09-04

## 2022-07-21 MED ORDER — METOPROLOL SUCCINATE ER 50 MG PO TB24: 50.0000 mg | ORAL_TABLET | Freq: Every day | ORAL | 3 refills | Status: AC

## 2022-07-21 MED ORDER — SUMATRIPTAN SUCCINATE 50 MG PO TABS: 50.0000 mg | ORAL_TABLET | ORAL | 11 refills | Status: DC | PRN

## 2022-07-21 MED ORDER — FREESTYLE LIBRE 3 SENSOR MISC
11 refills | Status: DC
Start: 2022-07-21 — End: 2022-09-04

## 2022-07-21 MED ORDER — VALSARTAN 40 MG PO TABS
40.0000 mg | ORAL_TABLET | Freq: Every day | ORAL | 0 refills | Status: DC
Start: 2022-07-21 — End: 2023-08-03

## 2022-07-21 MED ORDER — BASAGLAR KWIKPEN 100 UNIT/ML ~~LOC~~ SOPN
60.0000 [IU] | PEN_INJECTOR | Freq: Every day | SUBCUTANEOUS | 11 refills | Status: DC
Start: 2022-07-21 — End: 2022-09-23

## 2022-07-21 NOTE — Assessment & Plan Note (Signed)
Diabetes longstanding currently uncontrolled. Patient is able to verbalize appropriate hypoglycemia management plan. Medication adherence appears good. Control is suboptimal due to elevated blood glucose values. -Educated patient on benefits of CGM technology in managing diabetes. [employer] 3 sensor to patient's arm. Provided education on when to switch out sensor and how to prevent sensor from falling off. -Discontinued Tresiba -Started basal insulin Basaglar (insulin glargine) 60 units due to being preferred insulin on patient's formulary.  Patient will continue to titrate 2 units every day if fasting blood sugar > 100mg /dl until fasting blood sugars reach <150 mg/dL, then patient will continue at that dose  -Restarted GLP-1 Ozempic (semaglutide) 0.5 mg weekly. Patient was given sample pen in clinic and injected a dose today. -Hold SGLT2-I Jardiance (empagliflozin) 10 mg until overall blood sugars decrease -Extensively discussed pathophysiology of diabetes, recommended lifestyle interventions, dietary effects on blood sugar control.  -Counseled on s/sx of and management of hypoglycemia.  -Next A1c anticipated 08/25/22.

## 2022-07-21 NOTE — Progress Notes (Signed)
S:     Chief Complaint  Patient presents with   Medication Management    T2DM F/U   52 y.o. female who presents for diabetes evaluation, education, and management.  PMH is significant for T2DM, HTN.  Patient was referred and last seen by Primary Care Provider, Dr. Barbaraann Faster, on 07/17/22.  At last visit, Patient's Shirley Hernandez was increase to 60 units daily.   Today, patient arrives in good spirits and presents without any assistance.  Current diabetes medications include: Tresiba (insulin degludec) 60 units daily, Jardiance (empagliflozin) 10 mg daily (held due to recurrent yeast infections) Current hypertension medications include: Amlodipine 10 mg daily, valsartan 40 mg daily, metoprolol-XL 50 mg daily Current hyperlipidemia medications include: Atorvastatin 20 mg daily  Patient reports adherence to taking all medications as prescribed.   Do you feel that your medications are working for you? No. Patient experience frustration Have you been experiencing any side effects to the medications prescribed? no Do you have any problems obtaining medications due to transportation or finances? no Insurance coverage: [employer]  Patient denies hypoglycemic events. Was was experiencing lows while on glipizide, but not while on Tresiba.  Reported home fasting blood sugars: Wakes up with sugars >300, has consistent readings >300 throughout the day as reported on patient's glucometer.  Patient reports nocturia (nighttime urination). Gets up hourly to go to bathroom Patient reports neuropathy (nerve pain). Has tingling in hands Patient reports visual changes. Wakes up and vision is very blurry Patient denies self foot exams.   Patient reported dietary habits: Avoids sugary items, drinks zero sugar beverages.   Within the past 12 months, did you worry whether your food would run out before you got money to buy more? no Within the past 12 months, did the food you bought run out, and you didn't have  money to get more? no  Smokes 1/2 ppd. Patient wants to quit smoking. Patient has never quit in the past, but has cut down from 1 ppd 3 months ago without use of NRT. Patient rates IMPORTANCE of quitting at 9 Patient rates CONFIDENCE of quitting for 1 day by the end of July at 10   O:   Review of Systems  All other systems reviewed and are negative.   Physical Exam Constitutional:      Appearance: Normal appearance.  Pulmonary:     Effort: Pulmonary effort is normal.     Breath sounds: Normal breath sounds.  Neurological:     Mental Status: She is alert.  Psychiatric:        Mood and Affect: Mood normal.        Behavior: Behavior normal.        Thought Content: Thought content normal.        Judgment: Judgment normal.      Lab Results  Component Value Date   HGBA1C 12.6 (A) 05/25/2022   Vitals:   07/21/22 1410 07/21/22 1423  BP: (!) 149/98 (!) 146/93  Pulse: (!) 111   SpO2: 100%     Lipid Panel     Component Value Date/Time   CHOL 200 (H) 05/25/2022 1143   TRIG 181 (H) 05/25/2022 1143   HDL 38 (L) 05/25/2022 1143   CHOLHDL 5.3 (H) 05/25/2022 1143   CHOLHDL 5.9 07/28/2011 0831   VLDL 25 07/28/2011 0831   LDLCALC 130 (H) 05/25/2022 1143   LDLDIRECT 113 (H) 09/20/2013 0912    Clinical Atherosclerotic Cardiovascular Disease (ASCVD): No  The 10-year ASCVD risk score (Arnett  DK, et al., 2019) is: 40.9%   Values used to calculate the score:     Age: 74 years     Sex: Female     Is Non-Hispanic African American: Yes     Diabetic: Yes     Tobacco smoker: Yes     Systolic Blood Pressure: 146 mmHg     Is BP treated: Yes     HDL Cholesterol: 38 mg/dL     Total Cholesterol: 200 mg/dL    A/P: Diabetes longstanding currently uncontrolled. Patient is able to verbalize appropriate hypoglycemia management plan. Medication adherence appears good. Control is suboptimal due to elevated blood glucose values. -Educated patient on benefits of CGM technology in managing  diabetes. [employer] 3 sensor to patient's arm. Provided education on when to switch out sensor and how to prevent sensor from falling off. -Discontinued Tresiba -Started basal insulin Basaglar (insulin glargine) 60 units due to being preferred insulin on patient's formulary.  Patient will continue to titrate 2 units every day if fasting blood sugar > 100mg /dl until fasting blood sugars reach <150 mg/dL, then patient will continue at that dose  -Restarted GLP-1 Ozempic (semaglutide) 0.5 mg weekly. Patient was given sample pen in clinic and injected a dose today. - Hold  SGLT2-I Jardiance (empagliflozin) 10 mg until overall blood sugars decrease -Extensively discussed pathophysiology of diabetes, recommended lifestyle interventions, dietary effects on blood sugar control.  -Counseled on s/sx of and management of hypoglycemia.  -Next A1c anticipated 08/25/22.   Tobacco use disorder -Patient agrees to goal of reducing smoking to any amount less than 1/2 ppd by next visit -Patient is confident she can reduce and ultimately quit smoking on her own and with any NRT.  Written patient instructions provided. Patient verbalized understanding of treatment plan.  Total time in face to face counseling 35 minutes.    Follow-up:  Pharmacist 08/11/22. PCP visit 07/22/22 Patient seen with Jerry Caras, PharmD PGY-1 Pharmacy Resident and Revonda Standard, PharmD Candidate.

## 2022-07-21 NOTE — Assessment & Plan Note (Signed)
Patient agrees to goal of reducing smoking to any amount less than 1/2 ppd by next visit -Patient is confident she can reduce and ultimately quit smoking on her own and with any NRT.

## 2022-07-21 NOTE — Patient Instructions (Signed)
It was great seeing you today!  Here are the changes we are making to your medications today!  START Ozempic (semaglutide) 0.5 mg weekly (On your current pen, turn 20 clicks, then inject)  START Basaglar Kwikpen (insulin glargine) Inject 60 units daily PLUS 2 additional units each day until blood glucose values are <150 mg/dL. Then, continue at that dose

## 2022-07-22 ENCOUNTER — Encounter: Payer: Self-pay | Admitting: Student

## 2022-07-22 NOTE — Progress Notes (Signed)
Reviewed and agree with Dr Koval's plan.   
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
## 2022-07-23 ENCOUNTER — Ambulatory Visit: Payer: Self-pay

## 2022-07-28 ENCOUNTER — Telehealth: Payer: Self-pay | Admitting: Student

## 2022-07-28 ENCOUNTER — Telehealth: Payer: Self-pay

## 2022-07-28 ENCOUNTER — Encounter: Payer: Self-pay | Admitting: Student

## 2022-07-28 MED ORDER — METRONIDAZOLE 500 MG PO TABS: 500.0000 mg | ORAL_TABLET | Freq: Two times a day (BID) | ORAL | 0 refills | Status: DC

## 2022-07-28 MED ORDER — METRONIDAZOLE 500 MG PO TABS: 500.0000 mg | ORAL_TABLET | Freq: Two times a day (BID) | ORAL | 0 refills | Status: AC

## 2022-07-28 NOTE — Telephone Encounter (Signed)
Called to inform patient that she'd missed her last two appointments. She missed a check up for a pap smear today, and missed her endometrial biopsy last week. Explained to her the importance of getting the biopsy done, as post menopausal bleeding is concerning for cancer. Also told patient she needed a regular check up to see how her health was doing. Patient agreeable and planned to call and schedule appointment today.   Also told patient that she had a lesion on her labia that was concerning for a Genital Wart or HPV and that the laboratory had told me about a late result and that she had trichomonas. Told her I would send in a Rx for this.   Rx for metroniadazole sent in. 500 mg BID x 7 days

## 2022-07-28 NOTE — Telephone Encounter (Signed)
Patient calls nurse line in regards to antibiotic.   Patient advised medication was sent in this morning to [facility].   She reports this needs to go to [facility] on [address]   I have cancelled prescription at [facility] and resent to [facility]
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
## 2022-07-30 ENCOUNTER — Other Ambulatory Visit (HOSPITAL_COMMUNITY)
Admission: RE | Admit: 2022-07-30 | Discharge: 2022-07-30 | Disposition: A | Discharge status: Home / Self Care | Payer: Commercial Managed Care - HMO | Source: Ambulatory Visit | Attending: Family Medicine | Admitting: Family Medicine

## 2022-07-30 ENCOUNTER — Telehealth: Payer: Self-pay

## 2022-07-30 ENCOUNTER — Ambulatory Visit (INDEPENDENT_AMBULATORY_CARE_PROVIDER_SITE_OTHER): Payer: Commercial Managed Care - HMO | Admitting: Family Medicine

## 2022-07-30 VITALS — BP 136/80 | HR 90 | Wt 213.0 lb

## 2022-07-30 DIAGNOSIS — Z124 Encounter for screening for malignant neoplasm of cervix: Secondary | ICD-10-CM | POA: Insufficient documentation

## 2022-07-30 DIAGNOSIS — N939 Abnormal uterine and vaginal bleeding, unspecified: Secondary | ICD-10-CM

## 2022-07-30 NOTE — Telephone Encounter (Signed)
Spoke with patient regarding CGM report.   07/17/22-07/30/22 % Time CGM is active: 63% Average Glucose: 213 mg/dL Glucose Management Indicator: 8.4%  Glucose Variability: 26.4% (goal <36%) Time in Goal:  - Time in range 70-180: 32% - Time above range: 68% - Time below range: 0%  Patient reports she has been taking 20 units daily of Basaglar instead of the prescribed 60 units daily. Reports this is because her blood sugar was 'low', although no low readings are on CGM report. She was not able to pick up Ozempic. Spoke with patient's pharmacy who says Trulicity is preferred. Ozempic sample provided at last visit. Will have PharmD switch to Trulicity at follow up on 08/11/2022 based on insurance formulary. - Increase Basaglar from 20 units to 22 units daily starting tomorrow. Instructed to titrate 2 units every day if fasting blood sugar > 100mg /dl until fasting blood sugars reach <150 mg/dL, then patient will continue at that dose.   Also reports [employer] 3 sensors were $75. Spoke with patient's pharmacy who states this the price through her insurance. Unclear why CGM is not covered as she is on insulin regimen. Will request assistance from CPhT to determine coverage issue.   Valeda Malm, Pharm.D. PGY-2 Ambulatory Care Pharmacy Resident

## 2022-07-30 NOTE — Patient Instructions (Addendum)
It was great to see you! Thank you for allowing me to participate in your care!   Our plans for today:  - We will let you know what your pap smear shows - Your pelvic ultrasound is scheduled 08/21/2022  2:00 PM at [facility] IM[facility] AT [address]  - Please follow up with your PCP Dr. Melissa Noon in 4 weeks  Take care and seek immediate care sooner if you develop any concerns.  Levin Erp, MD

## 2022-07-31 ENCOUNTER — Encounter: Payer: Self-pay | Admitting: Family Medicine

## 2022-07-31 NOTE — Progress Notes (Signed)
CHIEF COMPLAINT / HPI: Patient here for further evaluation management of possible postmenopausal vaginal bleeding.  At last office visit she was diagnosed with a yeast infection and at that time she had seen some spots of blood on her underwear.  She thought it was coming from the vagina.  She was treated for yeast infection and has not had any spotting or bleeding since.  Previously she was on Depo-Provera for 20 years with her last injection for 5 years ago.  After she discontinued Depo-Provera she occasionally got some spotting for about 3 months but then has had no bleeding since then.  She does typically get monthly bloating and cramping that was similar to what she used to experience with her menses.  Says she had some type of procedure where they burned off some cells on her cervix about 20 years ago.  She is due for Pap smear.  Denies abdominal pain.  Received a call from provider at last visit saying that she had a positive test for trichomonas and so she was started on medication for that and she is currently on day two.   PERTINENT  PMH / PSH: I have reviewed the patient's medications, allergies, past medical and surgical history, smoking status and updated in the EMR as appropriate. Only prior pap results we have was from 2011 and it did not have presence of transformation zone.  PUS 2012: 1.  Uterus shows two small fibroids.  2.  Heterogeneous and dilated endometrial canal containing what  appear to be blood products.  Findings are suggestive of  hematometrocolpos which may be on the basis of cervical stenosis    Latest Reference Range & Units 07/17/22 14:55  Yeast Wet Prep HPF POC None  Few !  Clue Cells Wet Prep HPF POC None  None  WBC, Wet Prep HPF POC  1-5  Bacteria Wet Prep HPF POC Few  Few  Trichomonas Wet Prep HPF POC Absent  Absent    Had D&C 2012: Endometrium, curettage - BENIGN PROLIFERATIVE ENDOMETRIUM, NO ATYPIA, HYPERPLASIA OR MALIGNANCY. - ABUNDANT  BLOOD. - BENIGN SQUAMOUS MUCOSA AND ENDOCERVICAL MUCOSA, NO DYSPLASIA OR MALIGNANCY.    OBJECTIVE:  BP 136/80   Pulse 90   Wt 213 lb (96.6 kg)   LMP  (LMP Unknown)   SpO2 98%   BMI 35.45 kg/m  GENERAL: Well-developed female no acute distress GU: Externally normal.  On the left labia majora there is a wart that is about 6 or 7 mm in diameter.  It looks chronic.  No sign of excoriation or bleeding.  Speculum exam reveals normal appearing postmenopausal cervix.  There is no sign of any blood or discharge from the cervical os.  Bimanual exam is negative for unusual mass or tenderness.  ASSESSMENT / PLAN: VAGINAL BLEEDING, I do not think it was from cervix or endometrium.  Vaginal bleeding Previous provider had concern for postmenopausal bleeding: After long discussion with her today I do not think this was bleeding from the cervix or endometrium.  I think it was bleeding from her excoriated external genitalia related to the yeast infection.  We cannot find documentation in the chart about the trichomonas infection.  Evidently that was a late call by the laboratory.  Will continue and complete her current medication regimen for that.  We did perform Pap smear today we will follow that appropriately.  She is scheduled for pelvic ultrasound and I think it would probably best to just go ahead and complete  that.  Should her endometrium be extremely thickened on the pelvic ultrasound, then I would rethink revisiting the endometrial biopsy.  I have asked her to make an appoint with her PCP in 1 month just to follow-up and that certainly if she has any more bleeding from the vaginal area, would reconside endometrial biopsy.  I explained this to her at length and in detail and she agreed with the plan.  I answered all questions.   Denny Levy MD

## 2022-07-31 NOTE — Assessment & Plan Note (Addendum)
Previous provider had concern for postmenopausal bleeding: After long discussion with her today I do not think this was bleeding from the cervix or endometrium.  I think it was bleeding from her excoriated external genitalia related to the yeast infection.  We cannot find documentation in the chart about the trichomonas infection.  Evidently that was a late call by the laboratory.  Will continue and complete her current medication regimen for that.  We did perform Pap smear today we will follow that appropriately.  She is scheduled for pelvic ultrasound and I think it would probably best to just go ahead and complete that.  Should her endometrium be extremely thickened on the pelvic ultrasound, then I would rethink revisiting the endometrial biopsy.  I have asked her to make an appoint with her PCP in 1 month just to follow-up and that certainly if she has any more bleeding from the vaginal area, would reconside endometrial biopsy.  I explained this to her at length and in detail and she agreed with the plan.  I answered all questions.

## 2022-08-03 ENCOUNTER — Other Ambulatory Visit (HOSPITAL_COMMUNITY)
Admission: RE | Admit: 2022-08-03 | Discharge: 2022-08-03 | Disposition: A | Discharge status: Home / Self Care | Payer: Commercial Managed Care - HMO | Source: Ambulatory Visit | Attending: Family Medicine | Admitting: Family Medicine

## 2022-08-03 ENCOUNTER — Ambulatory Visit (INDEPENDENT_AMBULATORY_CARE_PROVIDER_SITE_OTHER): Payer: Commercial Managed Care - HMO | Admitting: Student

## 2022-08-03 ENCOUNTER — Encounter: Payer: Self-pay | Admitting: Student

## 2022-08-03 VITALS — BP 138/82 | HR 110 | Ht 64.0 in | Wt 211.0 lb

## 2022-08-03 DIAGNOSIS — Z1231 Encounter for screening mammogram for malignant neoplasm of breast: Secondary | ICD-10-CM | POA: Diagnosis not present

## 2022-08-03 DIAGNOSIS — Z122 Encounter for screening for malignant neoplasm of respiratory organs: Secondary | ICD-10-CM | POA: Diagnosis not present

## 2022-08-03 DIAGNOSIS — Z1159 Encounter for screening for other viral diseases: Secondary | ICD-10-CM

## 2022-08-03 DIAGNOSIS — Z113 Encounter for screening for infections with a predominantly sexual mode of transmission: Secondary | ICD-10-CM | POA: Diagnosis present

## 2022-08-03 DIAGNOSIS — Z1211 Encounter for screening for malignant neoplasm of colon: Secondary | ICD-10-CM | POA: Diagnosis not present

## 2022-08-03 DIAGNOSIS — E1165 Type 2 diabetes mellitus with hyperglycemia: Secondary | ICD-10-CM

## 2022-08-03 DIAGNOSIS — Z794 Long term (current) use of insulin: Secondary | ICD-10-CM
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
## 2022-08-03 NOTE — Patient Instructions (Signed)
It was great to see you! Thank you for allowing me to participate in your care!  I recommend that you always bring your medications to each appointment as this makes it easy to ensure we are on the correct medications and helps [place] not miss when refills are needed.  Our plans for today:  - Trichomonas Infection  Be sure to complete all antibiotics as prescribed. This is a sexually transmitted infection. Because you were positive for this, we are testing you for other sexually transmitted infections   -HIV/Syphilis/Gonorrhea/Chlamydia/Yeast  - Back Pain  Try using lidocaine patches on back (Salonpas).    Can use for 12 hours on, and then 12 hours off  Try using Voltaren gel, when not using lidocaine patches   Can use multiple times a day       -Health Maintenance  Screening for Hepatitis, an infection that can cause liver failure/cancer   Screening for Hepatitis B and Hepatitis C Screening you for Lung, Breast, and Colon Cancer   -Colonoscopy   -Chest CT scan   -Mammogram  We are checking some labs today, I will call you if they are abnormal will send you a MyChart message or a letter if they are normal.  If you do not hear about your labs in the next 2 weeks please let us know.  Take care and seek immediate care sooner if you develop any concerns.   Dr. Bess Kinds, MD [facility]

## 2022-08-04 LAB — CERVICOVAGINAL ANCILLARY ONLY
Bacterial Vaginitis (gardnerella): NEGATIVE
Candida Glabrata: NEGATIVE
Candida Vaginitis: NEGATIVE
Chlamydia: NEGATIVE
Comment: NEGATIVE
Comment: NEGATIVE
Comment: NEGATIVE
Comment: NEGATIVE
Comment: NORMAL
Neisseria Gonorrhea: NEGATIVE

## 2022-08-04 LAB — HCV INTERPRETATION

## 2022-08-04 NOTE — Assessment & Plan Note (Signed)
Patient reports compliance w/ medication. Notes she is taking 26 units basaglar and will continue increasing by 2 units a day, until cbg < 150. Also notes taking Ozempic 0.5mg  wkly. Patient has f/u w/ Dr. Raymondo Band for management of diabetes 08/11/22 -F/u w/ Dr. Raymondo Band 08/11/22 -Continue Basaglar 26 units, increase by 2 units to target cbg < 150 -Ozempic 0.5 mg wkly

## 2022-08-05 LAB — CYTOLOGY - PAP
Chlamydia: NEGATIVE
Comment: NEGATIVE
Comment: NEGATIVE
Comment: NEGATIVE
Comment: NORMAL
Diagnosis: NEGATIVE
High risk HPV: NEGATIVE
Neisseria Gonorrhea: NEGATIVE
Trichomonas: NEGATIVE

## 2022-08-05 LAB — HIV ANTIBODY (ROUTINE TESTING W REFLEX): HIV Screen 4th Generation wRfx: NONREACTIVE

## 2022-08-05 LAB — T PALLIDUM ANTIBODY, EIA: T pallidum Antibody, EIA: NEGATIVE

## 2022-08-05 LAB — RPR W/REFLEX TO TREPSURE: RPR: NONREACTIVE

## 2022-08-05 LAB — HCV AB W REFLEX TO QUANT PCR: HCV Ab: NONREACTIVE

## 2022-08-06 ENCOUNTER — Encounter: Payer: Self-pay | Admitting: Family Medicine

## 2022-08-11 ENCOUNTER — Inpatient Hospital Stay: Admission: RE | Admit: 2022-08-11 | Payer: Commercial Managed Care - HMO | Source: Ambulatory Visit

## 2022-08-11 ENCOUNTER — Ambulatory Visit: Payer: Commercial Managed Care - HMO | Admitting: Pharmacist

## 2022-08-20 ENCOUNTER — Other Ambulatory Visit: Payer: Self-pay | Admitting: Student

## 2022-08-20 DIAGNOSIS — I10 Essential (primary) hypertension: Secondary | ICD-10-CM

## 2022-08-21 ENCOUNTER — Inpatient Hospital Stay: Admission: RE | Admit: 2022-08-21 | Payer: Commercial Managed Care - HMO | Source: Ambulatory Visit

## 2022-08-31 ENCOUNTER — Ambulatory Visit: Payer: Self-pay | Admitting: Student

## 2022-09-03 ENCOUNTER — Inpatient Hospital Stay: Admission: RE | Admit: 2022-09-03 | Payer: Commercial Managed Care - HMO | Source: Ambulatory Visit

## 2022-09-04 ENCOUNTER — Other Ambulatory Visit: Payer: Self-pay

## 2022-09-04 DIAGNOSIS — G5603 Carpal tunnel syndrome, bilateral upper limbs: Secondary | ICD-10-CM

## 2022-09-04 DIAGNOSIS — I10 Essential (primary) hypertension: Secondary | ICD-10-CM

## 2022-09-04 DIAGNOSIS — E1165 Type 2 diabetes mellitus with hyperglycemia: Secondary | ICD-10-CM

## 2022-09-07 MED ORDER — FREESTYLE LIBRE 3 SENSOR MISC
11 refills | Status: DC
Start: 2022-09-07 — End: 2023-08-18

## 2022-09-07 MED ORDER — IBUPROFEN 600 MG PO TABS
ORAL_TABLET | ORAL | 0 refills | Status: DC
Start: 2022-09-07 — End: 2023-08-18

## 2022-09-07 MED ORDER — AMLODIPINE BESYLATE 10 MG PO TABS: 10.0000 mg | ORAL_TABLET | Freq: Every day | ORAL | 0 refills | Status: DC

## 2022-09-10 ENCOUNTER — Ambulatory Visit: Payer: Self-pay

## 2022-09-23 ENCOUNTER — Telehealth: Payer: Self-pay | Admitting: Pharmacist

## 2022-09-23 MED ORDER — BASAGLAR KWIKPEN 100 UNIT/ML ~~LOC~~ SOPN: 30.0000 [IU] | PEN_INJECTOR | Freq: Every day | SUBCUTANEOUS | 0 refills | Status: DC

## 2022-09-23 NOTE — Addendum Note (Signed)
Addended by: Kathrin Ruddy on: 09/23/2022 04:22 PM   Modules accepted: Orders

## 2022-09-23 NOTE — Telephone Encounter (Signed)
Patient contacted for follow-up of glucose control and medication supply following several missed appointments.   Since last contact patient reports she has obtained an application for Medicaid but has not completed, sent in or gone to a place for same day application/determination.   She liked the Jefferson 3 CGM but has been unable to afford sensors - is check blood sugars with fingers and readings have been elevated recently.  States her blood sugars were improved when using CGM.   Current Medications include: Ozempic 0.25mg  weekly, Lantus 30 units daily. (Limited supply of both medications) Patient denies any significant medication related side effects.  Medication Plan: -continue    Ozempic (semaglutide) at 0.25mg  weekly dose.  Medication Samples have been provided for the patient to pick-up on Friday 7/5  Drug name: Ozempic (semaglutide)       Strength: 0.25/0.5mg         Qty: 1 pen  LOT: [id number]  Exp.Date: 01/21/2024  The patient has been instructed regarding the correct time, dose, and frequency of taking this medication, including desired effects and most common side effects.  BAG labeled and in refrigerator. Shirley Hernandez 4:09 PM 09/23/2022   - Change Lantus to Basaglar (insulin glargine) Shirley Hernandez - voucher for 1 box of 5 pens available and provided for patient to take to pharmacy.  - Provided Libre 3 CGM sensor sample #1 BAG labeled and placed on counter in med room.   Patient asked to pick up items on 7/5 and on the same day go to the [facility] [address] [place] to apply for Medicaid at the same day assessment/decision office.  We were both hopeful that she would qualify.    Total time with patient call and documentation of interaction: 24 minutes.  F/U Phone call planned: None.  Asked patient to schedule back with PCP or with me in the next 2-3 weeks for facilitate additional care planning.

## 2022-09-25 NOTE — Telephone Encounter (Signed)
Supply given to patient.  

## 2022-09-25 NOTE — Telephone Encounter (Signed)
Reviewed and agree with Dr Koval's plan.   

## 2022-10-05 ENCOUNTER — Inpatient Hospital Stay: Admission: RE | Admit: 2022-10-05 | Payer: Commercial Managed Care - HMO | Source: Ambulatory Visit

## 2023-05-10 ENCOUNTER — Other Ambulatory Visit: Payer: Self-pay

## 2023-05-10 ENCOUNTER — Telehealth (HOSPITAL_COMMUNITY): Payer: Self-pay | Admitting: Emergency Medicine

## 2023-05-10 ENCOUNTER — Emergency Department (HOSPITAL_COMMUNITY)
Admission: EM | Admit: 2023-05-10 | Discharge: 2023-05-10 | Disposition: A | Discharge status: Home / Self Care | Payer: 59 | Attending: Emergency Medicine | Admitting: Emergency Medicine

## 2023-05-10 ENCOUNTER — Emergency Department (HOSPITAL_COMMUNITY): Payer: 59

## 2023-05-10 DIAGNOSIS — M545 Low back pain, unspecified: Secondary | ICD-10-CM | POA: Insufficient documentation

## 2023-05-10 DIAGNOSIS — Z79899 Other long term (current) drug therapy: Secondary | ICD-10-CM | POA: Insufficient documentation

## 2023-05-10 DIAGNOSIS — I10 Essential (primary) hypertension: Secondary | ICD-10-CM | POA: Insufficient documentation

## 2023-05-10 DIAGNOSIS — R1032 Left lower quadrant pain: Secondary | ICD-10-CM | POA: Insufficient documentation

## 2023-05-10 DIAGNOSIS — F17200 Nicotine dependence, unspecified, uncomplicated: Secondary | ICD-10-CM | POA: Diagnosis not present

## 2023-05-10 DIAGNOSIS — E119 Type 2 diabetes mellitus without complications: Secondary | ICD-10-CM | POA: Diagnosis not present

## 2023-05-10 DIAGNOSIS — Z794 Long term (current) use of insulin: Secondary | ICD-10-CM | POA: Insufficient documentation

## 2023-05-10 LAB — URINALYSIS, ROUTINE W REFLEX MICROSCOPIC
Bacteria, UA: NONE SEEN
Bilirubin Urine: NEGATIVE
Glucose, UA: >=500 mg/dL — AB
Ketones, ur: NEGATIVE mg/dL
Leukocytes,Ua: NEGATIVE
Nitrite: NEGATIVE
Protein, ur: NEGATIVE mg/dL
Specific Gravity, Urine: 1.025 (ref 1.005–1.030)
pH: 5 (ref 5.0–8.0)

## 2023-05-10 LAB — PREGNANCY, URINE: Preg Test, Ur: NEGATIVE

## 2023-05-10 MED ORDER — ACETAMINOPHEN 500 MG PO TABS: 500.0000 mg | ORAL_TABLET | Freq: Four times a day (QID) | ORAL | 0 refills | Status: DC | PRN

## 2023-05-10 MED ORDER — AMOXICILLIN-POT CLAVULANATE 875-125 MG PO TABS: 1.0000 | ORAL_TABLET | Freq: Two times a day (BID) | ORAL | 0 refills | Status: DC

## 2023-05-10 MED ORDER — HYDROCODONE-ACETAMINOPHEN 5-325 MG PO TABS: 1.0000 | ORAL_TABLET | Freq: Two times a day (BID) | ORAL | 0 refills | Status: DC | PRN

## 2023-05-10 MED ORDER — OXYCODONE-ACETAMINOPHEN 5-325 MG PO TABS
1.0000 | ORAL_TABLET | Freq: Three times a day (TID) | ORAL | 0 refills | Status: DC | PRN
Start: 2023-05-10 — End: 2023-08-03

## 2023-05-10 MED ORDER — ACETAMINOPHEN 325 MG PO TABS: 650.0000 mg | ORAL_TABLET | Freq: Once | ORAL | Status: DC

## 2023-05-10 NOTE — ED Notes (Signed)
Pt called stating she had to go to a different pharmacy and her meds are not there. Spoke with Dr Rhunette Croft who is sending meds to correct pharmacy.

## 2023-05-10 NOTE — ED Provider Notes (Signed)
[place] [facility] DEPARTMENT AT [facility] Provider Note   CSN: [id number] Arrival date & time: 05/10/23  0756     History  Chief Complaint  Patient presents with   Abdominal Pain   Back Pain    Shirley Hernandez is a 53 y.o. female.  HPI    53 year old female comes in with chief complaint of back pain, abdominal pain. Patient has a history of hypertension, diabetes and smokes about half a pack a day.  She states that she has been having low back pain, lower abdominal pain for the last 3 to 4 days.  She has had UTI in the past, which felt similar, so she has taken over-the-counter medications without relief.  She has no burning with urination.  She has also taken Motrin without significant relief.  Patient denies any blood in the urine, diarrhea or bloody stools.  No history of kidney stone.  No abdominal surgical history.  Home Medications Prior to Admission medications   Medication Sig Start Date End Date Taking? Authorizing Provider  acetaminophen (TYLENOL) 500 MG tablet Take 1 tablet (500 mg total) by mouth every 6 (six) hours as needed. 05/10/23  Yes Derwood Kaplan, MD  amoxicillin-clavulanate (AUGMENTIN) 875-125 MG tablet Take 1 tablet by mouth every 12 (twelve) hours. 05/10/23  Yes Derwood Kaplan, MD  HYDROcodone-acetaminophen (NORCO/VICODIN) 5-325 MG tablet Take 1 tablet by mouth every 12 (twelve) hours as needed. 05/10/23  Yes Derwood Kaplan, MD  albuterol (VENTOLIN HFA) 108 (90 Base) MCG/ACT inhaler Inhale 2 puffs into the lungs every 4 (four) hours as needed for wheezing or shortness of breath. 05/25/22   Littie Deeds, MD  amLODipine (NORVASC) 10 MG tablet Take 1 tablet (10 mg total) by mouth daily. 09/07/22   Dameron, Nolberto Hanlon, DO  atorvastatin (LIPITOR) 20 MG tablet Take 1 tablet (20 mg total) by mouth daily. 05/25/22   Littie Deeds, MD  blood glucose meter kit and supplies KIT Dispense based on patient and insurance preference. Use up to four times daily as directed.  08/28/20   Melene Plan, MD  Blood Glucose Monitoring Suppl (CONTOUR NEXT ONE) KIT Use as directed to check blood glucose once daily 04/02/21   McDiarmid, Leighton Roach, MD  Blood Pressure Monitoring (BLOOD PRESSURE MONITOR AUTOMAT) DEVI 1 kit by Does not apply route daily. Please call the office if you have any issues obtaining this monitor. 07/13/19   Melene Plan, MD  Continuous Glucose Sensor (FREESTYLE LIBRE 3 SENSOR) MISC Place 1 sensor on the skin every 14 days. Use to check glucose continuously 09/07/22   Dameron, Nolberto Hanlon, DO  glucose blood (CONTOUR NEXT TEST) test strip Use as instructed to check blood glucose once daily 04/02/21   McDiarmid, Leighton Roach, MD  ibuprofen (ADVIL) 600 MG tablet TAKE 1 TABLET BY MOUTH WITH FOOD EVERY 8 HOURS AS NEEDED 09/07/22   Dameron, Nolberto Hanlon, DO  Insulin Glargine (BASAGLAR KWIKPEN) 100 UNIT/ML Inject 30 Units into the skin daily. 09/23/22   McDiarmid, Leighton Roach, MD  JARDIANCE 10 MG TABS tablet TAKE 1 TABLET BY MOUTH EVERY DAY Patient not taking: Reported on 07/21/2022 01/12/22   Darral Dash, DO  Lancet Device MISC Use as instructed to check blood glucose once daily 04/02/21   McDiarmid, Leighton Roach, MD  Lancets (FREESTYLE) lancets Use as instructed to check blood glucose once daily 10/03/20   Moses Manners, MD  metoprolol succinate (TOPROL-XL) 50 MG 24 hr tablet Take 1 tablet (50 mg total) by mouth daily. Take with  or immediately following a meal. 07/21/22   McDiarmid, Leighton Roach, MD  mirtazapine (REMERON) 15 MG tablet TAKE 1 TABLET BY MOUTH EVERYDAY AT BEDTIME 06/18/22   Dameron, Nolberto Hanlon, DO  Semaglutide,0.25 or 0.5MG /DOS, 2 MG/3ML SOPN Inject 0.5 mg into the skin once a week. 07/21/22   McDiarmid, Leighton Roach, MD  SUMAtriptan (IMITREX) 50 MG tablet Take 1 tablet (50 mg total) by mouth every 2 (two) hours as needed for migraine. May repeat in 2 hours if headache persists or recurs. 07/21/22   McDiarmid, Leighton Roach, MD  triamcinolone (KENALOG) 0.025 % ointment APPLY TO AFFECTED AREA DAILY AS NEEDED  05/25/22   Littie Deeds, MD  valsartan (DIOVAN) 40 MG tablet Take 1 tablet (40 mg total) by mouth daily. 07/21/22   McDiarmid, Leighton Roach, MD      Allergies    Metformin and related    Review of Systems   Review of Systems  All other systems reviewed and are negative.   Physical Exam Updated Vital Signs BP (!) 167/110 (BP Location: Right Arm)   Pulse 90   Temp 98.1 F (36.7 C) (Oral)   Resp 18   Ht 5\' 4"  (1.626 m)   Wt 95.7 kg   SpO2 100%   BMI 36.21 kg/m  Physical Exam Vitals and nursing note reviewed.  Constitutional:      Appearance: She is well-developed.  HENT:     Head: Atraumatic.  Cardiovascular:     Rate and Rhythm: Normal rate.  Pulmonary:     Effort: Pulmonary effort is normal.  Abdominal:     Tenderness: There is abdominal tenderness in the left lower quadrant. There is no guarding or rebound.  Musculoskeletal:     Cervical back: Normal range of motion and neck supple.  Skin:    General: Skin is warm and dry.  Neurological:     Mental Status: She is alert and oriented to person, place, and time.     ED Results / Procedures / Treatments   Labs (all labs ordered are listed, but only abnormal results are displayed) Labs Reviewed  URINALYSIS, ROUTINE W REFLEX MICROSCOPIC - Abnormal; Notable for the following components:      Result Value   Color, Urine STRAW (*)    Glucose, UA >=500 (*)    Hgb urine dipstick SMALL (*)    All other components within normal limits  PREGNANCY, URINE    EKG None  Radiology CT ABDOMEN PELVIS WO CONTRAST Result Date: 05/10/2023 CLINICAL DATA:  Low back and lower abdominal pain EXAM: CT ABDOMEN AND PELVIS WITHOUT CONTRAST TECHNIQUE: Multidetector CT imaging of the abdomen and pelvis was performed following the standard protocol without IV contrast. RADIATION DOSE REDUCTION: This exam was performed according to the departmental dose-optimization program which includes automated exposure control, adjustment of the mA and/or kV  according to patient size and/or use of iterative reconstruction technique. COMPARISON:  None Available. FINDINGS: Lower chest: No focal consolidation or pulmonary nodule in the lung bases. No pleural effusion or pneumothorax demonstrated. Partially imaged heart size is normal. Hepatobiliary: No focal hepatic lesions. No intra or extrahepatic biliary ductal dilation. Normal gallbladder. Pancreas: No focal lesions or main ductal dilation. Spleen: Normal in size without focal abnormality. Adrenals/Urinary Tract: No adrenal nodules. No suspicious renal mass on this noncontrast enhanced examination, calculi or hydronephrosis. No focal bladder wall thickening. Stomach/Bowel: Normal appearance of the stomach. No evidence of bowel wall thickening, distention, or inflammatory changes. Normal appendix. Vascular/Lymphatic: Aortic atherosclerosis. No enlarged abdominal  or pelvic lymph nodes. Reproductive: No adnexal masses. Other: No free fluid, fluid collection, or free air. Musculoskeletal: No acute or abnormal lytic or blastic osseous lesions. IMPRESSION: 1. No acute abdominopelvic findings. 2.  Aortic Atherosclerosis (ICD10-I70.0). Electronically Signed   By: Agustin Cree M.D.   On: 05/10/2023 10:03    Procedures Procedures    Medications Ordered in ED Medications  acetaminophen (TYLENOL) tablet 650 mg (has no administration in time range)    ED Course/ Medical Decision Making/ A&P                                 Medical Decision Making Amount and/or Complexity of Data Reviewed Labs: ordered. Radiology: ordered.  Risk OTC drugs. Prescription drug management.   53 year old female with history of diabetes, hypertension and no abdominal surgical history comes in with chief complaint of abdominal pain, back pain.  I reviewed patient's records including previous imaging.  Unfortunately, there is no colonoscopy or CT scan in our records.  She had an ultrasound pelvis in 2012.  Patient has no burning  with urination, blood in the urine, vaginal discharge, bleeding and no diarrhea.  Differential diagnosis considered for this patient includes kidney stone, diverticulitis, colitis, acute cystitis, uterine fibroid, ovarian cyst.  Clinically, does not appear to be torsion.  We will check U pregnancy, but patient does not think she is pregnant.  10:35 AM I have independently interpreted patient's CT abdomen pelvis.  There is no evidence of hydronephrosis.  Per radiologist, no evidence of intra-abdominal infection or diverticulitis.  Given that patient does have some urinary discomfort, there is left lower quadrant pain, we will start her on Augmentin that should cover her for both occult diverticulitis or UTI.  Patient has known history of fibroids.  CT scan did not show any evidence of adnexal mass.  It seems like last year, her PCP had also ordered ultrasound pelvis.  Patient states that because of insurance issues, she was unable to get it.  She is currently insured.  I have low suspicion for pelvic emergency at this time.  Patient denies any vaginal discharge, STI risk factors.  Plan is for patient to follow-up with her PCP.  We have advised that she return to the ER if she starts having worsening pain, vaginal bleeding, fevers, chills, nausea, vomiting.  Patient comfortable with the plan.  Final Clinical Impression(s) / ED Diagnoses Final diagnoses:  Left lower quadrant abdominal pain    Rx / DC Orders ED Discharge Orders          Ordered    amoxicillin-clavulanate (AUGMENTIN) 875-125 MG tablet  Every 12 hours        05/10/23 1025    HYDROcodone-acetaminophen (NORCO/VICODIN) 5-325 MG tablet  Every 12 hours PRN        05/10/23 1025    acetaminophen (TYLENOL) 500 MG tablet  Every 6 hours PRN        05/10/23 1026              Derwood Kaplan, MD 05/10/23 1036

## 2023-05-10 NOTE — ED Triage Notes (Signed)
Pt arrived reporting low back pain and lower abdominal pain. Denies any urinary symptoms. Reports taking azo, stating the last time she had this she had a UTI. Denies hx of stones. No fever, chills, N/V or any other symptoms symptoms

## 2023-05-10 NOTE — Telephone Encounter (Signed)
Sending percocet to different pharmacy

## 2023-05-10 NOTE — Discharge Instructions (Signed)
Please follow-up with your primary care doctor as soon as possible for additional workup.  Start taking the antibiotics and pain medicine that is prescribed.  Return to the emergency room if you start having worsening pain, bloody stools, vaginal discharge or bleeding, fevers, severe nausea and vomiting.

## 2023-07-27 ENCOUNTER — Other Ambulatory Visit: Payer: Self-pay | Admitting: Family Medicine

## 2023-07-28 NOTE — Telephone Encounter (Signed)
 Patient returns call to nurse line regarding prescription refill.   Advised of rx refill policy. Forwarding request to PCP.   Elsie Halo, RN

## 2023-07-30 NOTE — Telephone Encounter (Signed)
 Patient calls nurse line voicing frustration her medication was denied.   Patient advised we have not seen her in almost one year and she needs an apt.   Patient stated she had "problems" with her insurance, and therefore, unable to make apts.   Patient scheduled for Monday for DM FU.   Patient then stated, "Well I guess yall dont care I've been out of my insulin  for over one month, I'm just going to go to the ED."  Patient hung up- unsure if she is going to come Monday or not.

## 2023-08-02 ENCOUNTER — Encounter: Payer: Self-pay | Admitting: Student

## 2023-08-02 ENCOUNTER — Other Ambulatory Visit: Payer: Self-pay

## 2023-08-02 ENCOUNTER — Ambulatory Visit: Payer: Self-pay | Admitting: Student

## 2023-08-02 VITALS — BP 143/96 | HR 105 | Ht 64.0 in | Wt 206.6 lb

## 2023-08-02 DIAGNOSIS — Z794 Long term (current) use of insulin: Secondary | ICD-10-CM

## 2023-08-02 DIAGNOSIS — I10 Essential (primary) hypertension: Secondary | ICD-10-CM

## 2023-08-02 DIAGNOSIS — E1165 Type 2 diabetes mellitus with hyperglycemia: Secondary | ICD-10-CM

## 2023-08-02 LAB — POCT GLYCOSYLATED HEMOGLOBIN (HGB A1C): HbA1c POC (<> result, manual entry): >15 % (ref 4.0–5.6)

## 2023-08-02 LAB — GLUCOSE, POCT (MANUAL RESULT ENTRY): POC Glucose: 536 mg/dL — AB (ref 70–99)

## 2023-08-02 MED ORDER — ACCU-CHEK SOFTCLIX LANCETS MISC
12 refills | Status: AC
  Filled 2023-08-02: qty 100, 30d supply, fill #0

## 2023-08-02 MED ORDER — ACCU-CHEK GUIDE W/DEVICE KIT
PACK | 0 refills | Status: AC
  Filled 2023-08-02: qty 1, 30d supply, fill #0

## 2023-08-02 MED ORDER — INSULIN ASPART 100 UNIT/ML IJ SOLN: 10.0000 [IU] | Freq: Once | INTRAMUSCULAR | Status: DC

## 2023-08-02 MED ORDER — ACCU-CHEK GUIDE TEST VI STRP
ORAL_STRIP | 12 refills | Status: DC
  Filled 2023-08-02: qty 100, 30d supply, fill #0

## 2023-08-02 MED ORDER — BASAGLAR KWIKPEN 100 UNIT/ML ~~LOC~~ SOPN
10.0000 [IU] | PEN_INJECTOR | SUBCUTANEOUS | 11 refills | Status: DC
  Filled 2023-08-02: qty 3, 30d supply, fill #0

## 2023-08-02 MED ORDER — INSULIN GLARGINE 100 UNIT/ML SOLOSTAR PEN
10.0000 [IU] | PEN_INJECTOR | SUBCUTANEOUS | 11 refills | Status: DC
  Filled 2023-08-02: qty 3, 30d supply, fill #0

## 2023-08-02 MED ORDER — BD PEN NEEDLE MICRO U/F 32G X 6 MM MISC
3 refills | Status: DC
  Filled 2023-08-02: qty 100, 30d supply, fill #0

## 2023-08-02 NOTE — Progress Notes (Signed)
  SUBJECTIVE:   CHIEF COMPLAINT / HPI:   Type 2 Diabetes: Home medications include: basaglar  30u daily. Does not endorse compliance - see below. Home glucose monitoring is not performed. Patient is not up to date on diabetic eye. Endorses polyuria, polydipsia, fatigue. Notes last CBG was 300s last week. She has not taken her insulin  for about 2 months.   Hypertension: Home medications include: Valsartan  40 mg daily, metoprolol  XL 50 mg daily. She does not endorse taking these medications as prescribed. Patient has had a BMP in the past 1 year.  She is not taking medications for anything because of insurance.   PERTINENT  PMH / PSH: HTN, T2DM, HLD, eczema, migraines, anxiety, tobacco use disorder  OBJECTIVE:  BP (!) 143/96   Pulse (!) 105   Ht 5\' 4"  (1.626 m)   Wt 206 lb 9.6 oz (93.7 kg)   SpO2 100%   BMI 35.46 kg/m  General: NAD CV: Tachycardic, normal rhythm, no murmurs appreciable Pulm: Normal WOB  ASSESSMENT/PLAN:   Assessment & Plan Type 2 diabetes mellitus with hyperglycemia, with long-term current use of insulin  (HCC) Poor control due to medication nonadherence secondary to insurance difficulty.  A1c greater than 15, symptomatic with polyuria polydipsia and fatigue although fortunately no nausea or vomiting.  CBG 536.  Obtain BMP for kidney function.  Obtained microalbumin/creatinine urine ratio. Initiate Lantus  10 units daily and supplies sent to [facility] health pharmacy with support program and assisted by clinical pharmacy tech.  Appointment with clinical pharmacist tomorrow, advised to bring all medications. Essential hypertension BP: (!) 143/96 today. Poorly controlled due to medication nonadherence secondary to insurance difficulty. Goal of <140/90. Medication regimen: Restart valsartan  40 mg daily, amlodipine  10mg  daily, metoprolol  XL 50 mg daily when able.  Will follow-up in 1 week for this. Return in about 1 week (around 08/09/2023). Veronia Goon, DO 08/02/2023, 11:09  AM PGY-3, [facility]

## 2023-08-02 NOTE — Assessment & Plan Note (Addendum)
 BP: (!) 143/96 today. Poorly controlled due to medication nonadherence secondary to insurance difficulty. Goal of <140/90. Medication regimen: Restart valsartan  40 mg daily, amlodipine  10mg  daily, metoprolol  XL 50 mg daily when able.  Will follow-up in 1 week for this.

## 2023-08-02 NOTE — Assessment & Plan Note (Addendum)
 Poor control due to medication nonadherence secondary to insurance difficulty.  A1c greater than 15, symptomatic with polyuria polydipsia and fatigue although fortunately no nausea or vomiting.  CBG 536.  Obtain BMP for kidney function.  Obtained microalbumin/creatinine urine ratio. Initiate Lantus  10 units daily and supplies sent to [facility] health pharmacy with support program and assisted by clinical pharmacy tech.  Appointment with clinical pharmacist tomorrow, advised to bring all medications.

## 2023-08-02 NOTE — Patient Instructions (Addendum)
 It was great to see you today! Thank you for choosing [facility] for your primary care.  Today we addressed: Going to give you some insulin , you are potentially in DKA which is a very bad outcome of uncontrolled diabetes.  We provided insulin  today and get you set up with clinical pharmacy.  I am obtaining blood work that should this be concerning I may recommend you proceed to the ED. Your blood pressure was also high, I do recommend following up with us  in the next 1 to 2 weeks and restarting your blood pressure medications.  I will send all of your blood pressure medications and your insulin  to your pharmacy for you to pick up when your insurance goes through.  If you haven't already, sign up for My Chart to have easy access to your labs results, and communication with your primary care physician.  Please arrive 15 minutes before your appointment to ensure smooth check in process.  We appreciate your efforts in making this happen.  Thank you for allowing me to participate in your care, Veronia Goon, DO 08/02/2023, 10:17 AM PGY-3, [facility]

## 2023-08-03 ENCOUNTER — Ambulatory Visit: Payer: Self-pay | Admitting: Pharmacist

## 2023-08-03 ENCOUNTER — Telehealth: Payer: Self-pay | Admitting: Student

## 2023-08-03 ENCOUNTER — Encounter: Payer: Self-pay | Admitting: Pharmacist

## 2023-08-03 VITALS — BP 144/97 | HR 98 | Wt 204.2 lb

## 2023-08-03 DIAGNOSIS — Z794 Long term (current) use of insulin: Secondary | ICD-10-CM

## 2023-08-03 DIAGNOSIS — E1165 Type 2 diabetes mellitus with hyperglycemia: Secondary | ICD-10-CM

## 2023-08-03 DIAGNOSIS — I10 Essential (primary) hypertension: Secondary | ICD-10-CM

## 2023-08-03 DIAGNOSIS — E785 Hyperlipidemia, unspecified: Secondary | ICD-10-CM

## 2023-08-03 LAB — MICROALBUMIN / CREATININE URINE RATIO
Creatinine, Urine: 63.2 mg/dL
Microalb/Creat Ratio: 10 mg/g{creat} (ref 0–29)
Microalbumin, Urine: 6.1 ug/mL

## 2023-08-03 LAB — BASIC METABOLIC PANEL WITH GFR
BUN/Creatinine Ratio: 15 (ref 9–23)
BUN: 18 mg/dL (ref 6–24)
CO2: 20 mmol/L (ref 20–29)
Calcium: 9.4 mg/dL (ref 8.7–10.2)
Chloride: 96 mmol/L (ref 96–106)
Creatinine, Ser: 1.17 mg/dL — ABNORMAL HIGH (ref 0.57–1.00)
Glucose: 535 mg/dL (ref 70–99)
Potassium: 4.5 mmol/L (ref 3.5–5.2)
Sodium: 134 mmol/L (ref 134–144)
eGFR: 55 mL/min/{1.73_m2} — ABNORMAL LOW (ref >59)

## 2023-08-03 MED ORDER — INSULIN GLARGINE 100 UNIT/ML SOLOSTAR PEN: 20.0000 [IU] | PEN_INJECTOR | SUBCUTANEOUS | Status: DC

## 2023-08-03 MED ORDER — VALSARTAN 40 MG PO TABS
40.0000 mg | ORAL_TABLET | Freq: Every day | ORAL | 3 refills | Status: AC
Start: 2023-08-03 — End: ?

## 2023-08-03 MED ORDER — ATORVASTATIN CALCIUM 20 MG PO TABS
20.0000 mg | ORAL_TABLET | Freq: Every day | ORAL | 3 refills | Status: DC
Start: 2023-08-03 — End: 2023-08-18

## 2023-08-03 MED ORDER — SEMAGLUTIDE(0.25 OR 0.5MG/DOS) 2 MG/3ML ~~LOC~~ SOPN: 0.2500 mg | PEN_INJECTOR | SUBCUTANEOUS | Status: AC

## 2023-08-03 NOTE — Assessment & Plan Note (Signed)
 Hypertension longstanding currently uncontrolled. Blood pressure goal of <130/80 mmHg. Patient reports only taking her amlodipine  daily, has not been taking the valsartan  or the metoprolol  succinate. Blood pressure control is suboptimal due to inability to obtain all medications. -Restarted valsartan  40 mg daily -Continue amlodipine  10 mg daily -Provided a home blood pressure cuff to patient to monitor blood pressure at home, encouraged patient to check blood pressure daily -Did not restart metoprolol , will recheck blood pressure at upcoming appointment to determine appropriateness of additional agents

## 2023-08-03 NOTE — Assessment & Plan Note (Signed)
 Diabetes longstanding currently uncontrolled, A1C yesterday, 08/02/13, >15%. Patient is able to verbalize appropriate hypoglycemia management plan. Medication adherence appears good when she is able to obtain her medications. Patient previously reported taking Ozempic  (semaglutide ) and insulin  glargine 40 units daily. Control is suboptimal due to loss of insurance and inability to obtain medications. -Restarted basal insulin  Lantus  (insulin  glargine) 20 units daily in the morning. Patient will continue to titrate 2 units every day if blood sugar > 150 mg/dl until fasting blood sugars reach goal or next visit, up to a max dose of 40 units daily.  -Restarted GLP-1 Ozempic  (semaglutide ) using 1 mg sample pen - twist pen to 20 clicks and administer dose once weekly -Continued SGLT2-I Jardiance  (empagliflozin ) 10 mg. Counseled on sick day rules. -Patient educated on purpose, proper use, and potential adverse effects.  -Extensively discussed pathophysiology of diabetes, recommended lifestyle interventions, dietary effects on blood sugar control.  -Counseled on s/sx of and management of hypoglycemia.  -Next A1c anticipated August 2025.

## 2023-08-03 NOTE — Telephone Encounter (Signed)
 Received call from [facility] that patients Glc was 535, critically high.   On chart review, patient had blood work drawn yesterday in clinic w/ Glc 536, similar to what was drawn at [facility]. This test was collected to evaluate kidney function, although Cr / GFR was not given to provider over phone, only glc.   Given that this value is similar to what was seen in clinic and patient has close follow up today w/ Clinical Pharmacist Dr. Koval, did not feel need to call patient and send to ED.

## 2023-08-03 NOTE — Progress Notes (Signed)
 S:     Chief Complaint  Patient presents with   Medication Management    Diabetes Follow-up   53 y.o. female who presents for diabetes evaluation, education, and management. Patient arrives in good spirits and presents without any assistance. Patient was referred and last seen by Primary Care Provider, Dr. Thomas Fleischer, on 08/02/2023.   Since last visit, patient has stopped taking all of her diabetes medications except Jardiance  (empagliflozin ) due to issues with insurance. Patient seen by Dr. Janae Mclean yesterday, 08/02/23, and patient able to obtain insulin  glargine from [facility] and took 10 units this morning.   PMH is significant for HTN, HLD, T2DM, tobacco use.   Current diabetes medications include: Jardiance  (empagliflozin ) 10 mg (per patient report, previously on Ozempic  (semaglutide ) and Tresiba  (insulin  degludec) 40 units daily) Current hypertension medications include: amlodipine  10 mg (per patient report, previously on valsartan  40 mg and metoprolol  succinate 50 mg) Current hyperlipidemia medications include: not taking currently (patient previously on atorvastatin  20 mg)  Do you feel that your medications are working for you? no Have you been experiencing any side effects to the medications prescribed? no Do you have any problems obtaining medications due to transportation or finances? Yes - patient unable to pay for insurance until recently when she obtained a new job Insurance coverage: pending coverage  Patient denies hypoglycemic events. Patient reports blood glucose >300 mg/dL this morning (1/61 AM). Patient reports frequent nocturia (nighttime urination).   O:   Review of Systems  All other systems reviewed and are negative.   Physical Exam Constitutional:      Appearance: Normal appearance.  Neurological:     Mental Status: She is alert.  Psychiatric:        Mood and Affect: Mood normal.        Behavior: Behavior normal.     Lab Results  Component Value Date    HGBA1C >15.0 08/02/2023   Vitals:   08/03/23 1104 08/03/23 1109  BP: (!) 146/100 (!) 144/97  Pulse: 98   SpO2: 99%     Lipid Panel     Component Value Date/Time   CHOL 200 (H) 05/25/2022 1143   TRIG 181 (H) 05/25/2022 1143   HDL 38 (L) 05/25/2022 1143   CHOLHDL 5.3 (H) 05/25/2022 1143   CHOLHDL 5.9 07/28/2011 0831   VLDL 25 07/28/2011 0831   LDLCALC 130 (H) 05/25/2022 1143   LDLDIRECT 113 (H) 09/20/2013 0912    Clinical Atherosclerotic Cardiovascular Disease (ASCVD): No  The 10-year ASCVD risk score (Arnett DK, et al., 2019) is: 40.1%   Values used to calculate the score:     Age: 73 years     Sex: Female     Is Non-Hispanic African American: Yes     Diabetic: Yes     Tobacco smoker: Yes     Systolic Blood Pressure: 144 mmHg     Is BP treated: Yes     HDL Cholesterol: 38 mg/dL     Total Cholesterol: 200 mg/dL   A/P: Diabetes longstanding currently uncontrolled, A1C yesterday, 08/02/13, >15%. Patient is able to verbalize appropriate hypoglycemia management plan. Medication adherence appears good when she is able to obtain her medications. Patient previously reported taking Ozempic  (semaglutide ) and insulin  glargine 40 units daily. Control is suboptimal due to loss of insurance and inability to obtain medications. -Restarted basal insulin  Lantus  (insulin  glargine) 20 units daily in the morning. Patient will continue to titrate 2 units every day if blood sugar > 150 mg/dl until  fasting blood sugars reach goal or next visit, up to a max dose of 40 units daily.  -Restarted GLP-1 Ozempic  (semaglutide ) using 1 mg sample pen - twist pen to 20 clicks and administer dose once weekly -Continued SGLT2-I Jardiance  (empagliflozin ) 10 mg. Counseled on sick day rules. -Patient educated on purpose, proper use, and potential adverse effects.  -Extensively discussed pathophysiology of diabetes, recommended lifestyle interventions, dietary effects on blood sugar control.  -Counseled on s/sx of  and management of hypoglycemia.  -Next A1c anticipated August 2025.   ASCVD risk - primary in patient with diabetes. Last LDL is 130 (05/25/22) not at goal of <70 mg/dL. ASCVD risk factors include HTN, T2DM, HLD and 10-year ASCVD risk score of 40.1%. High intensity statin indicated. Patient has not been taking the atorvastatin  and is hesitant to restart at this time.  -Restarted atorvastatin  20 mg daily -Recheck lipid panel and consider dose increase at future appointment  Hypertension longstanding currently uncontrolled. Blood pressure goal of <130/80 mmHg. Patient reports only taking her amlodipine  daily, has not been taking the valsartan  or the metoprolol  succinate. Blood pressure control is suboptimal due to inability to obtain all medications. -Restarted valsartan  40 mg daily -Continue amlodipine  10 mg daily -Provided a home blood pressure cuff to patient to monitor blood pressure at home, encouraged patient to check blood pressure daily -Did not restart metoprolol , will recheck blood pressure at upcoming appointment to determine appropriateness of additional agents  Written patient instructions provided. Patient verbalized understanding of treatment plan.  Total time in face to face counseling 33 minutes.    Follow-up:  Pharmacist 08/18/23 PCP clinic visit in TBD Patient seen with Volney Grumbles, PharmD, PGY-1 pharmacy resident.

## 2023-08-03 NOTE — Assessment & Plan Note (Signed)
 ASCVD risk - primary in patient with diabetes. Last LDL is 130 (05/25/22) not at goal of <70 mg/dL. ASCVD risk factors include HTN, T2DM, HLD and 10-year ASCVD risk score of 40.1%. High intensity statin indicated. Patient has not been taking the atorvastatin  and is hesitant to restart at this time.  -Restarted atorvastatin  20 mg daily -Recheck lipid panel and consider dose increase at future appointment

## 2023-08-03 NOTE — Patient Instructions (Addendum)
 It was nice to see you today!  Your goal blood sugar is 80-130 before eating and less than 180 after eating.  Medication Changes: START Ozempic  20 clicks subcutaneous weekly  START Insulin  glargine 20 units daily, increase by 2 units if BG >150 mg/dL (max dose: 40 units daily)  START valsartan  40 mg daily  CONTINUE amlodipine  10 mg daily  START atorvastatin  20 mg daily  Continue all other medication the same.  Monitor blood sugars at home and keep a log (glucometer or piece of paper) to bring with you to your next visit.  Keep up the good work with diet and exercise. Aim for a diet full of vegetables, fruit and lean meats (chicken, [place], fish). Try to limit salt intake by eating fresh or frozen vegetables (instead of canned), rinse canned vegetables prior to cooking and do not add any additional salt to meals.

## 2023-08-04 ENCOUNTER — Ambulatory Visit: Payer: Self-pay | Admitting: Student

## 2023-08-04 NOTE — Telephone Encounter (Signed)
 Attempted to call regarding lab work, reflects hyperglycemia which was known and AKI.  Voicemail box full, recommendation is for patient to drink 1 to 2 L of water daily.  Will recheck labs at follow-up visit on 5/21.  She has already been appropriately set up to increase insulin  based on CBG checks.

## 2023-08-04 NOTE — Progress Notes (Signed)
 Reviewed and agree with Dr Macky Lower plan.

## 2023-08-04 NOTE — Telephone Encounter (Signed)
 Patient returns call to nurse line. Verified name and DOB.   Advised of message per Dr. Janae Mclean. Patient states that her blood sugar is doing better today, with AM CBG of 213.  She is currently asymptomatic.   Discussed return precautions.   Elsie Halo, RN

## 2023-08-11 ENCOUNTER — Ambulatory Visit: Payer: Self-pay | Admitting: Student

## 2023-08-11 NOTE — Progress Notes (Deleted)
  SUBJECTIVE:   CHIEF COMPLAINT / HPI:   Type 2 Diabetes: Home medications include: Lantus  *** units daily, Ozempic  1 mg weekly, Jardiance  10 mg daily. {Blank single:19197::"Does","Does not"} endorse compliance. Home glucose monitoring {home testing:315145}. Patient is not up to date on diabetic eye.   Hypertension: Home medications include: Amlodipine  10 mg daily, valsartan  40 mg daily. She {Blank single:19197::"endorses","does not endorse"} taking these medications as prescribed.    PERTINENT  PMH / PSH: HTN, T2DM, HLD, eczema, migraines, anxiety, tobacco use disorder   OBJECTIVE:  There were no vitals taken for this visit. ***  ASSESSMENT/PLAN:   Assessment & Plan  No follow-ups on file. Veronia Goon, DO 08/11/2023, 7:58 AM PGY-3, [facility] {    This will disappear when note is signed, click to select method of visit    :1}

## 2023-08-18 ENCOUNTER — Encounter: Payer: Self-pay | Admitting: Pharmacist

## 2023-08-18 ENCOUNTER — Ambulatory Visit: Payer: Self-pay | Admitting: Pharmacist

## 2023-08-18 VITALS — BP 119/80 | HR 95 | Wt 164.2 lb

## 2023-08-18 DIAGNOSIS — E785 Hyperlipidemia, unspecified: Secondary | ICD-10-CM

## 2023-08-18 DIAGNOSIS — G5603 Carpal tunnel syndrome, bilateral upper limbs: Secondary | ICD-10-CM

## 2023-08-18 DIAGNOSIS — I10 Essential (primary) hypertension: Secondary | ICD-10-CM

## 2023-08-18 DIAGNOSIS — E1165 Type 2 diabetes mellitus with hyperglycemia: Secondary | ICD-10-CM

## 2023-08-18 DIAGNOSIS — Z794 Long term (current) use of insulin: Secondary | ICD-10-CM

## 2023-08-18 DIAGNOSIS — F419 Anxiety disorder, unspecified: Secondary | ICD-10-CM

## 2023-08-18 MED ORDER — DEXCOM G7 SENSOR MISC: 11 refills | Status: AC

## 2023-08-18 MED ORDER — AMLODIPINE BESYLATE 10 MG PO TABS: 10.0000 mg | ORAL_TABLET | Freq: Every day | ORAL | 3 refills | Status: AC

## 2023-08-18 MED ORDER — ATORVASTATIN CALCIUM 20 MG PO TABS: 20.0000 mg | ORAL_TABLET | Freq: Every day | ORAL | 3 refills | Status: AC

## 2023-08-18 MED ORDER — MIRTAZAPINE 15 MG PO TABS: 15.0000 mg | ORAL_TABLET | Freq: Every evening | ORAL | 1 refills | Status: AC | PRN

## 2023-08-18 MED ORDER — IBUPROFEN 600 MG PO TABS: ORAL_TABLET | ORAL | 0 refills | Status: AC

## 2023-08-18 MED ORDER — FLUCONAZOLE 150 MG PO TABS
150.0000 mg | ORAL_TABLET | ORAL | 0 refills | Status: DC
Start: 2023-08-18 — End: 2023-09-29

## 2023-08-18 MED ORDER — SUMATRIPTAN SUCCINATE 50 MG PO TABS: 50.0000 mg | ORAL_TABLET | ORAL | 11 refills | Status: AC | PRN

## 2023-08-18 MED ORDER — INSULIN GLARGINE 100 UNIT/ML SOLOSTAR PEN: 20.0000 [IU] | PEN_INJECTOR | SUBCUTANEOUS | 3 refills | Status: DC

## 2023-08-18 NOTE — Assessment & Plan Note (Signed)
 Hypertension longstanding currently well controlled. Blood pressure goal of <130/80 mmHg. Medication adherence appears good. - Continue valsartan  40 mg daily - Continue amlodipine  10 mg daily - Metoprolol  not restarted as blood pressure currently at goal

## 2023-08-18 NOTE — Assessment & Plan Note (Signed)
 Diabetes longstanding for 9 years (since 2016) currently uncontrolled, A1C >15% on 08/03/23. Patient is able to verbalize appropriate hypoglycemia management plan. Medication adherence appears good, except not taking insulin  as prescribed. Control is suboptimal due to loss of insurance and difficulty obtaining medications. -Adjusted dose of basal insulin  Lantus  (insulin  glargine) 20 units each morning. Encouraged patient to not adjust dose of insulin  unless instructed by a healthcare provider.  - Instructed patient to contact [facility] if she has repeat glucose readings <70 mg/dL -Continued GLP-1  Ozempic  (semaglutide ) using 1 mg sample pen - twist pen to 20 clicks and administer dose once weekly -Continued SGLT2-I Jardiance  (empagliflozin ) 25 mg. Counseled on sick day rules. -Provided patient with free samples of Dexcom G7 for continuous glucose monitoring. First sensor placed and connected to phone during visit. Prescription sent for additional sensors to patient's home pharmacy. -Patient educated on purpose, proper use, and potential adverse effects.  -Extensively discussed pathophysiology of diabetes, recommended lifestyle interventions, dietary effects on blood sugar control.  -Counseled on s/sx of and management of hypoglycemia.

## 2023-08-18 NOTE — Progress Notes (Signed)
 S:     Chief Complaint  Patient presents with   Medication Management    Diabetes follow-up   53 y.o. female who presents for diabetes evaluation, education, and management. Patient arrives in good spirits and presents without any assistance. She has reported increased pain and frequency of headaches since her last visit. PMH is significant for HTN, HLD, T2DM, tobacco use.   Patient reports Diabetes was diagnosed in 2016.   Patient was referred and last seen by Primary Care Provider, Dr. Janae Mclean, on 08/02/2023. At the last pharmacy visit on 08/03/2023, patient was restarted on Lantus  (insulin  glargine), Ozempic  (Semaglutide ), and valsartan .   Current diabetes medications include: Lantus  (insulin  glargine) 10-25 units daily, Ozempic  (semaglutide ) 0.25mg  (20 clicks on 1 mg pen) weekly, Jardiance  (empagliflozin ) 25 mg daily Current hypertension medications include: amlodipine  10 mg, valsartan  40 mg Current hyperlipidemia medications include: atorvastatin  20 mg  Patient reports taking Lantus  (insulin  glargine) 10 to 25 units each morning based on her most recent glucose reading. She also reports taking an additional dose of Lantus  (insulin  glargine) 5 units in the afternoon if her sugar remains elevated. Patient reports adherence to all other medications.  Patient reports checking her blood glucose twice a day. Patient denies hypoglycemic events. She reports having worse headaches when her sugar decreases to <200 mg/dL. Reported blood sugars vary between 160-260 mg/dL.   Patient reports visual changes. She reports her vision worsening recently, and she has scheduled an eye appointment for the upcoming month.   O:   Review of Systems  Musculoskeletal:  Positive for back pain.    Physical Exam Constitutional:      Appearance: Normal appearance.  Neurological:     Mental Status: She is alert.  Psychiatric:        Mood and Affect: Mood normal.        Behavior: Behavior normal.      Lab Results  Component Value Date   HGBA1C >15.0 08/02/2023   Vitals:   08/18/23 1138  BP: 119/80  Pulse: 95  SpO2: 98%    Lipid Panel     Component Value Date/Time   CHOL 200 (H) 05/25/2022 1143   TRIG 181 (H) 05/25/2022 1143   HDL 38 (L) 05/25/2022 1143   CHOLHDL 5.3 (H) 05/25/2022 1143   CHOLHDL 5.9 07/28/2011 0831   VLDL 25 07/28/2011 0831   LDLCALC 130 (H) 05/25/2022 1143   LDLDIRECT 113 (H) 09/20/2013 0912    Clinical Atherosclerotic Cardiovascular Disease (ASCVD): No  The 10-year ASCVD risk score (Arnett DK, et al., 2019) is: 22.6%   Values used to calculate the score:     Age: 30 years     Sex: Female     Is Non-Hispanic African American: Yes     Diabetic: Yes     Tobacco smoker: Yes     Systolic Blood Pressure: 119 mmHg     Is BP treated: Yes     HDL Cholesterol: 38 mg/dL     Total Cholesterol: 200 mg/dL   A/P: Diabetes longstanding for 9 years (since 2016) currently uncontrolled, A1C >15% on 08/03/23. Patient is able to verbalize appropriate hypoglycemia management plan. Medication adherence appears good, except not taking insulin  as prescribed. Control is suboptimal due to loss of insurance and difficulty obtaining medications. -Adjusted dose of basal insulin  Lantus  (insulin  glargine) 20 units each morning. Encouraged patient to not adjust dose of insulin  unless instructed by a healthcare provider.  - Instructed patient to contact [facility]  if she has repeat glucose readings <70 mg/dL -Continued GLP-1  Ozempic  (semaglutide ) using 1 mg sample pen - twist pen to 20 clicks and administer dose once weekly -Continued SGLT2-I Jardiance  (empagliflozin ) 25 mg. Counseled on sick day rules. -Provided patient with free samples of Dexcom G7 for continuous glucose monitoring. First sensor placed and connected to phone during visit. Prescription sent for additional sensors to patient's home pharmacy. -Patient educated on purpose, proper use, and potential  adverse effects.  -Extensively discussed pathophysiology of diabetes, recommended lifestyle interventions, dietary effects on blood sugar control.  -Fluconazole  150mg  X1 with repeat dose on day #3 prescribed -Counseled on s/sx of and management of hypoglycemia.  -Next A1c anticipated August 2025.    ASCVD risk - primary prevention in patient with diabetes. Last LDL is 130 (05/25/22) not at goal of <70 mg/dL. ASCVD risk factors include HTN, T2DM, HLD and 10-year ASCVD risk score of 22.6%. high intensity statin indicated.  - Continued atorvastatin  20 mg daily - Consider rechecking lipid panel and further dose increase at a future appointment  Hypertension longstanding currently well controlled. Blood pressure goal of <130/80 mmHg. Medication adherence appears good. - Continue valsartan  40 mg daily - Continue amlodipine  10 mg daily - Metoprolol  not restarted as blood pressure currently at goal - Encouraged patient to utilize home blood pressure cuff (provided at last pharmacy appointment)  Yeast infection - patient reports symptoms of vaginal itchiness that have been persistent for the past week. Patient reports taking over the counter medications without symptom relief.  - Sent prescription for fluconazole  150 mg tablets - one tablet today (Wednesday) and one tablet on day 3 (Friday)  Arthritis pain - patient reported increased back pain that has not been relieved with over the counter medications. Patient reports previously being prescribed ibuprofen  600 mg tablets that provided mild pain relief.  - Refilled ibuprofen  600 mg every 8 hours as needed for pain - Pain visit scheduled with Dr. Dameron (PCP) on 08/27/23 for further pain management  Written patient instructions provided. Patient verbalized understanding of treatment plan.  Total time in face to face counseling 43 minutes.    Follow-up:  Pharmacist 09/08/2023 PCP clinic visit in 08/27/2023 Patient seen with Volney Grumbles, PharmD,  PGY-1 pharmacy resident.

## 2023-08-18 NOTE — Patient Instructions (Signed)
 It was nice to see you today!  Your goal blood sugar is 80-130 before eating and less than 180 after eating.  Medication Changes: Increase Lantus  (insulin  glargine) 20 units each day - do not adjust doses of insulin  without contact the [facility]  Take fluconazole  1 tablet today (Wednesday) and 1 tablet on Friday  If you see repeat readings <70 mg/dL, please contact Dr. Velecia Ovitt at the [facility]  Continue all other medication the same.  Keep up the good work with diet and exercise. Aim for a diet full of vegetables, fruit and lean meats (chicken, [place], fish). Try to limit salt intake by eating fresh or frozen vegetables (instead of canned), rinse canned vegetables prior to cooking and do not add any additional salt to meals.

## 2023-08-18 NOTE — Assessment & Plan Note (Signed)
 ASCVD risk - primary prevention in patient with diabetes. Last LDL is 130 (05/25/22) not at goal of <70 mg/dL. ASCVD risk factors include HTN, T2DM, HLD and 10-year ASCVD risk score of 22.6%. high intensity statin indicated.  - Continued atorvastatin  20 mg daily - Consider rechecking lipid panel and further dose increase at a future appointment

## 2023-08-20 NOTE — Progress Notes (Signed)
 Reviewed and agree with Dr Macky Lower plan.

## 2023-08-27 ENCOUNTER — Ambulatory Visit: Payer: Self-pay | Admitting: Student

## 2023-08-31 ENCOUNTER — Telehealth: Payer: Self-pay

## 2023-08-31 DIAGNOSIS — Z794 Long term (current) use of insulin: Secondary | ICD-10-CM

## 2023-08-31 MED ORDER — INSULIN GLARGINE 100 UNIT/ML SOLOSTAR PEN: 20.0000 [IU] | PEN_INJECTOR | SUBCUTANEOUS | 3 refills | Status: DC

## 2023-08-31 NOTE — Telephone Encounter (Signed)
 Patient calls nurse line regarding Lantus  prescription. She reports that pharmacy still does not have prescription.   Per chart review, rx was sent to "no print."   Resent prescription to patient's preferred pharmacy.   Elsie Halo, RN

## 2023-09-03 ENCOUNTER — Other Ambulatory Visit: Payer: Self-pay | Admitting: Family Medicine

## 2023-09-03 DIAGNOSIS — E1165 Type 2 diabetes mellitus with hyperglycemia: Secondary | ICD-10-CM

## 2023-09-08 ENCOUNTER — Ambulatory Visit: Payer: Self-pay | Admitting: Pharmacist

## 2023-09-23 ENCOUNTER — Telehealth: Payer: Self-pay | Admitting: Pharmacist

## 2023-09-23 ENCOUNTER — Other Ambulatory Visit (HOSPITAL_COMMUNITY): Payer: Self-pay

## 2023-09-23 NOTE — Telephone Encounter (Signed)
 Patient contacted for follow-up of missed appointment and glucose control.  Patient thanked me for call and agreed to make appointment for follow-up next week.   Since last contact patient reports Her CGM cost is several hundred dollars.  I asked if that was during her deductible period and she did not think it was her deductible that increased cost.  I shared that I would have a test claim run to determine cost at this time.   Reports testing home glucose readings and states they are improved.   Total time with patient call and documentation of interaction: 11 minutes.  Visit with me 7/9 at 10:30

## 2023-09-27 NOTE — Telephone Encounter (Signed)
 Reviewed and agree with Dr Macky Lower plan.

## 2023-09-29 ENCOUNTER — Encounter: Payer: Self-pay | Admitting: Pharmacist

## 2023-09-29 ENCOUNTER — Ambulatory Visit: Admitting: Pharmacist

## 2023-09-29 ENCOUNTER — Other Ambulatory Visit: Payer: Self-pay | Admitting: Family Medicine

## 2023-09-29 VITALS — BP 122/89 | HR 102 | Wt 201.4 lb

## 2023-09-29 DIAGNOSIS — Z794 Long term (current) use of insulin: Secondary | ICD-10-CM

## 2023-09-29 DIAGNOSIS — F17200 Nicotine dependence, unspecified, uncomplicated: Secondary | ICD-10-CM | POA: Diagnosis not present

## 2023-09-29 DIAGNOSIS — E1165 Type 2 diabetes mellitus with hyperglycemia: Secondary | ICD-10-CM

## 2023-09-29 MED ORDER — INSULIN GLARGINE 100 UNIT/ML SOLOSTAR PEN
30.0000 [IU] | PEN_INJECTOR | SUBCUTANEOUS | 3 refills | Status: DC
Start: 2023-09-29 — End: 2023-09-30

## 2023-09-29 MED ORDER — INSULIN PEN NEEDLE 29G X 4MM MISC
1.0000 | 99 refills | Status: AC | PRN
Start: 2023-09-29 — End: ?

## 2023-09-29 MED ORDER — VARENICLINE TARTRATE 0.5 MG PO TABS: 0.5000 mg | ORAL_TABLET | Freq: Every day | ORAL | 1 refills | Status: DC

## 2023-09-29 MED ORDER — GLUCOSE BLOOD VI STRP
ORAL_STRIP | 12 refills | Status: AC
Start: 2023-09-29 — End: ?

## 2023-09-29 NOTE — Assessment & Plan Note (Signed)
 Diabetes longstanding for 9 years (since 2016) currently remains uncontrolled however likely improved control with use of insulin .  Willing to continue with CGM. Patient is able to verbalize appropriate hypoglycemia management plan. Medication adherence appears good. Control is suboptimal due to loss of insurance and difficulty obtaining medications. -Adjusted dose of basal insulin  Lantus  (insulin  glargine) from 20 to 30 units each morning.  -Continued GLP-1  Ozempic  (semaglutide ) ~ 0.25mg  weekly (using 1 mg sample pen - twist pen to 20 clicks and administer dose) once weekly -Continued SGLT2-I Jardiance  (empagliflozin ) 25 mg. Counseled on sick day rules. -Provided patient with 3 free samples of Dexcom G7 for continuous glucose monitoring. First sensor placed and connected to phone during visit.  -Patient educated on purpose, proper use, and potential adverse effects.  -Extensively discussed pathophysiology of diabetes, recommended lifestyle interventions, dietary effects on blood sugar control. Spent significant portion of visit with CGM sensor re-activation/initiation.

## 2023-09-29 NOTE — Progress Notes (Signed)
 S:     Chief Complaint  Patient presents with   Medication Management    Diabetes - CGM   53 y.o. female who presents for diabetes evaluation, education, and management. Patient arrives in good spirits and presents without any assistance.   PMH is significant for HTN, HLD, T2DM, tobacco use.    Patient reports Diabetes was diagnosed in 2016.    Patient was referred and last seen by Primary Care Provider, Dr. Orlando, on 08/02/2023. At the last pharmacy visit on 08/18/2023, patient was adjusted on Lantus  (insulin  glargine) tp 20 units daily, restarted on Ozempic  (Semaglutide ) at ~ 0.25mg  weekly.  DEXCOM G7 sensor was placed at that time as well.    Current diabetes medications include: Lantus  (insulin  glargine) 20 units daily, Ozempic  (semaglutide ) 0.25mg  (20 clicks on 1 mg pen) weekly, Jardiance  (empagliflozin ) 25 mg daily Current hypertension medications include: amlodipine  10 mg, valsartan  40 mg Current hyperlipidemia medications include: atorvastatin  20 mg  Patient reports adherence to all other medications. Reports DEXCOM sensor    Patient denies hypoglycemic events.  At this time, reports smoking ~ 6 cigarettes per day.  Willing to work toward quit in the next several weeks.  Following discussion of options, patient willing to initiate varenicline  0.5mg  daily with food.   Dexcom sensor came off over night and patient has been unable to obtain CGM coverage through insurance.  Note - patient reports she anticipates employment in the next 6 weeks and believes she will have better insurance at that time.   Reached out to pharmacy and determined that she continues to have remaining deductible with current insurance.    O:   Review of Systems  All other systems reviewed and are negative.   Physical Exam Vitals reviewed.  Constitutional:      Appearance: Normal appearance.  Pulmonary:     Effort: Pulmonary effort is normal.  Neurological:     Mental Status: She is alert.   Psychiatric:        Mood and Affect: Mood normal.        Behavior: Behavior normal.        Thought Content: Thought content normal.    No DEXCOM report available today - patient has not had sensor on in the last two weeks.  Willing to restart with use of sample in short-term.   Lab Results  Component Value Date   HGBA1C >15.0 08/02/2023   Vitals:   09/29/23 1045  BP: 122/89  Pulse: (!) 102  SpO2: 96%    Lipid Panel     Component Value Date/Time   CHOL 200 (H) 05/25/2022 1143   TRIG 181 (H) 05/25/2022 1143   HDL 38 (L) 05/25/2022 1143   CHOLHDL 5.3 (H) 05/25/2022 1143   CHOLHDL 5.9 07/28/2011 0831   VLDL 25 07/28/2011 0831   LDLCALC 130 (H) 05/25/2022 1143   LDLDIRECT 113 (H) 09/20/2013 0912    Clinical Atherosclerotic Cardiovascular Disease (ASCVD): No  The 10-year ASCVD risk score (Arnett DK, et al., 2019) is: 24.5%   Values used to calculate the score:     Age: 68 years     Clincally relevant sex: Female     Is Non-Hispanic African American: Yes     Diabetic: Yes     Tobacco smoker: Yes     Systolic Blood Pressure: 122 mmHg     Is BP treated: Yes     HDL Cholesterol: 38 mg/dL     Total Cholesterol: 200 mg/dL  A/P: Diabetes longstanding for 9 years (since 2016) currently remains uncontrolled however likely improved control with use of insulin .  Willing to continue with CGM. Patient is able to verbalize appropriate hypoglycemia management plan. Medication adherence appears good. Control is suboptimal due to loss of insurance and difficulty obtaining medications. -Adjusted dose of basal insulin  Lantus  (insulin  glargine) from 20 to 30 units each morning.  -Continued GLP-1  Ozempic  (semaglutide ) ~ 0.25mg  weekly (using 1 mg sample pen - twist pen to 20 clicks and administer dose) once weekly -Continued SGLT2-I Jardiance  (empagliflozin ) 25 mg. Counseled on sick day rules. -Provided patient with 3 free samples of Dexcom G7 for continuous glucose monitoring. First sensor  placed and connected to phone during visit.  -Patient educated on purpose, proper use, and potential adverse effects.  -Extensively discussed pathophysiology of diabetes, recommended lifestyle interventions, dietary effects on blood sugar control. Spent significant portion of visit with CGM sensor re-activation/initiation.  - Follow-up in pharmacy clinic for more assistance, prior to starting new position with new insurance   Visit with me on 11/01/2023    ASCVD risk - primary prevention in patient with diabetes. Last LDL is 130 (05/25/22) not at goal of <70 mg/dL. ASCVD risk factors include HTN, T2DM, HLD and 10-year ASCVD risk score of 22.6%. high intensity statin indicated.  - Continued atorvastatin  20 mg daily - Consider rechecking lipid panel and further dose increase at a future appointment   Hypertension longstanding currently well controlled with elevated diastolic. Blood pressure goal of <130/80 mmHg. Medication adherence appears good. - Continue valsartan  40 mg daily - Continue amlodipine  10 mg daily - Metoprolol  not restarted today  Tobacco Use Disorder - reports smoking ~ 6 cigarettes per day.  Willing to work toward quit in the next several weeks.  Following discussion of options, patient willing to initiate varenicline  0.5mg  daily with food.     Written patient instructions provided. Patient verbalized understanding of treatment plan.  Total time in face to face counseling 27 minutes.    Follow-up:  Pharmacist 11/01/2023 PCP clinic visit in TBD

## 2023-09-29 NOTE — Addendum Note (Signed)
 Addended by: Khelani Kops G on: 09/29/2023 01:17 PM   Modules accepted: Orders

## 2023-09-29 NOTE — Assessment & Plan Note (Signed)
 Tobacco Use Disorder - reports smoking ~ 6 cigarettes per day.  Willing to work toward quit in the next several weeks.  Following discussion of options, patient willing to initiate varenicline  0.5mg  daily with food.

## 2023-09-29 NOTE — Patient Instructions (Signed)
 It was nice to see you today!  Your goal blood sugar is 80-130 before eating and less than 180 after eating.  Medication Changes: START  Varenicline  0.5mg  once WITH FOOD  Increase Lantus  (insulin  glargine) to 30 units once daily.   Continue all other medication the same.   Keep up the good work with diet and exercise. Aim for a diet full of vegetables, fruit and lean meats (chicken, [place], fish). Try to limit salt intake by eating fresh or frozen vegetables (instead of canned), rinse canned vegetables prior to cooking and do not add any additional salt to meals.

## 2023-09-30 MED ORDER — TRESIBA FLEXTOUCH 100 UNIT/ML ~~LOC~~ SOPN: 30.0000 [IU] | PEN_INJECTOR | Freq: Every day | SUBCUTANEOUS | 11 refills | Status: AC

## 2023-09-30 NOTE — Addendum Note (Signed)
 Addended by: Mechel Haggard G on: 09/30/2023 10:04 AM   Modules accepted: Orders

## 2023-10-01 NOTE — Progress Notes (Signed)
 Reviewed and agree with Dr Macky Lower plan.

## 2023-11-01 ENCOUNTER — Ambulatory Visit: Admitting: Pharmacist

## 2023-11-01 ENCOUNTER — Telehealth: Payer: Self-pay | Admitting: Pharmacist

## 2023-11-01 NOTE — Telephone Encounter (Signed)
 Attempted to contact patient for follow-up of missed appointment.  Tobacco intake and diabetes control.   Left HIPAA compliant voice mail requesting call back/reschedule to direct phone: [phone]  Total time with patient call and documentation of interaction: 4 minutes.  Follow-up phone call planned: None

## 2023-11-02 NOTE — Telephone Encounter (Signed)
 Reviewed and agree with Dr Rennis plan.

## 2024-01-06 ENCOUNTER — Telehealth: Payer: Self-pay | Admitting: Pharmacist

## 2024-01-06 NOTE — Telephone Encounter (Signed)
 Attempted to contact patient for follow-up of diabetes control and smoking cessation   Left HIPAA compliant voice mail requesting call back to direct phone: [phone]  Total time with patient call and documentation of interaction: 3 minutes.

## 2024-04-03 ENCOUNTER — Telehealth: Payer: Self-pay | Admitting: Pharmacist

## 2024-04-03 ENCOUNTER — Other Ambulatory Visit: Payer: Self-pay | Admitting: Family Medicine

## 2024-04-03 DIAGNOSIS — F17200 Nicotine dependence, unspecified, uncomplicated: Secondary | ICD-10-CM

## 2024-04-03 NOTE — Telephone Encounter (Signed)
 Patient contacted for follow-up of history of elevated glucose and tobacco used.   Patient reports improvement with both and plans to schedule follow-up appointment in near future.   Total time with patient call and documentation of interaction: 4 minutes.
